# Patient Record
Sex: Female | Born: 1980 | Race: White | Hispanic: No | Smoking: Former smoker
Health system: Southern US, Community
[De-identification: ages and names within clinical notes are randomized; demographics above are authoritative.]

## PROBLEM LIST (undated history)

## (undated) DIAGNOSIS — I10 Essential (primary) hypertension: Secondary | ICD-10-CM

## (undated) DIAGNOSIS — M549 Dorsalgia, unspecified: Secondary | ICD-10-CM

## (undated) HISTORY — PX: KNEE SURGERY: SHX244

## (undated) HISTORY — PX: COSMETIC SURGERY: SHX468

## (undated) HISTORY — PX: BACK SURGERY: SHX140

---

## 2001-02-19 ENCOUNTER — Emergency Department (HOSPITAL_COMMUNITY): Admission: EM | Admit: 2001-02-19 | Discharge: 2001-02-19 | Payer: Self-pay | Admitting: Emergency Medicine

## 2001-03-03 ENCOUNTER — Ambulatory Visit (HOSPITAL_COMMUNITY)
Admission: RE | Admit: 2001-03-03 | Discharge: 2001-03-03 | Payer: Self-pay | Admitting: Physical Medicine and Rehabilitation

## 2001-03-03 ENCOUNTER — Encounter: Payer: Self-pay | Admitting: Physical Medicine and Rehabilitation

## 2001-03-21 ENCOUNTER — Encounter: Payer: Self-pay | Admitting: Physical Medicine and Rehabilitation

## 2001-03-21 ENCOUNTER — Encounter
Admission: RE | Admit: 2001-03-21 | Discharge: 2001-03-21 | Payer: Self-pay | Admitting: Physical Medicine and Rehabilitation

## 2001-06-03 ENCOUNTER — Encounter: Payer: Self-pay | Admitting: Specialist

## 2001-06-03 ENCOUNTER — Encounter (INDEPENDENT_AMBULATORY_CARE_PROVIDER_SITE_OTHER): Payer: Self-pay

## 2001-06-03 ENCOUNTER — Observation Stay (HOSPITAL_COMMUNITY): Admission: RE | Admit: 2001-06-03 | Discharge: 2001-06-05 | Payer: Self-pay | Admitting: Specialist

## 2002-01-03 ENCOUNTER — Encounter: Payer: Self-pay | Admitting: Emergency Medicine

## 2002-01-03 ENCOUNTER — Emergency Department (HOSPITAL_COMMUNITY): Admission: EM | Admit: 2002-01-03 | Discharge: 2002-01-03 | Payer: Self-pay | Admitting: Emergency Medicine

## 2002-12-12 ENCOUNTER — Emergency Department (HOSPITAL_COMMUNITY): Admission: EM | Admit: 2002-12-12 | Discharge: 2002-12-12 | Payer: Self-pay | Admitting: Emergency Medicine

## 2003-03-09 ENCOUNTER — Emergency Department (HOSPITAL_COMMUNITY): Admission: EM | Admit: 2003-03-09 | Discharge: 2003-03-09 | Payer: Self-pay | Admitting: Emergency Medicine

## 2003-05-20 ENCOUNTER — Ambulatory Visit (HOSPITAL_COMMUNITY): Admission: AD | Admit: 2003-05-20 | Discharge: 2003-05-20 | Payer: Self-pay | Admitting: Obstetrics and Gynecology

## 2003-10-14 ENCOUNTER — Ambulatory Visit (HOSPITAL_COMMUNITY): Admission: AD | Admit: 2003-10-14 | Discharge: 2003-10-14 | Payer: Self-pay | Admitting: Obstetrics and Gynecology

## 2003-10-19 ENCOUNTER — Inpatient Hospital Stay (HOSPITAL_COMMUNITY): Admission: RE | Admit: 2003-10-19 | Discharge: 2003-10-22 | Payer: Self-pay | Admitting: Obstetrics and Gynecology

## 2004-08-13 ENCOUNTER — Emergency Department (HOSPITAL_COMMUNITY): Admission: EM | Admit: 2004-08-13 | Discharge: 2004-08-13 | Payer: Self-pay | Admitting: *Deleted

## 2004-11-03 ENCOUNTER — Emergency Department (HOSPITAL_COMMUNITY): Admission: EM | Admit: 2004-11-03 | Discharge: 2004-11-04 | Payer: Self-pay | Admitting: *Deleted

## 2004-11-05 ENCOUNTER — Emergency Department (HOSPITAL_COMMUNITY): Admission: EM | Admit: 2004-11-05 | Discharge: 2004-11-05 | Payer: Self-pay | Admitting: *Deleted

## 2004-11-08 ENCOUNTER — Emergency Department (HOSPITAL_COMMUNITY): Admission: EM | Admit: 2004-11-08 | Discharge: 2004-11-08 | Payer: Self-pay | Admitting: Emergency Medicine

## 2004-12-11 ENCOUNTER — Emergency Department (HOSPITAL_COMMUNITY): Admission: EM | Admit: 2004-12-11 | Discharge: 2004-12-12 | Payer: Self-pay | Admitting: Emergency Medicine

## 2005-11-30 ENCOUNTER — Inpatient Hospital Stay (HOSPITAL_COMMUNITY): Admission: AD | Admit: 2005-11-30 | Discharge: 2005-12-02 | Payer: Self-pay | Admitting: Obstetrics and Gynecology

## 2006-02-11 ENCOUNTER — Emergency Department (HOSPITAL_COMMUNITY): Admission: EM | Admit: 2006-02-11 | Discharge: 2006-02-11 | Payer: Self-pay | Admitting: Emergency Medicine

## 2006-09-16 ENCOUNTER — Emergency Department (HOSPITAL_COMMUNITY): Admission: EM | Admit: 2006-09-16 | Discharge: 2006-09-16 | Payer: Self-pay | Admitting: Emergency Medicine

## 2007-02-05 ENCOUNTER — Other Ambulatory Visit: Admission: RE | Admit: 2007-02-05 | Discharge: 2007-02-05 | Payer: Self-pay | Admitting: Obstetrics and Gynecology

## 2007-03-19 ENCOUNTER — Emergency Department (HOSPITAL_COMMUNITY): Admission: EM | Admit: 2007-03-19 | Discharge: 2007-03-19 | Payer: Self-pay | Admitting: Emergency Medicine

## 2007-03-20 ENCOUNTER — Emergency Department (HOSPITAL_COMMUNITY): Admission: EM | Admit: 2007-03-20 | Discharge: 2007-03-20 | Payer: Self-pay | Admitting: Emergency Medicine

## 2007-05-03 ENCOUNTER — Emergency Department (HOSPITAL_COMMUNITY): Admission: EM | Admit: 2007-05-03 | Discharge: 2007-05-03 | Payer: Self-pay | Admitting: Emergency Medicine

## 2007-07-23 ENCOUNTER — Emergency Department (HOSPITAL_COMMUNITY): Admission: EM | Admit: 2007-07-23 | Discharge: 2007-07-23 | Payer: Self-pay | Admitting: Emergency Medicine

## 2008-09-13 ENCOUNTER — Emergency Department (HOSPITAL_COMMUNITY): Admission: EM | Admit: 2008-09-13 | Discharge: 2008-09-13 | Payer: Self-pay | Admitting: Emergency Medicine

## 2008-09-20 ENCOUNTER — Emergency Department (HOSPITAL_COMMUNITY): Admission: EM | Admit: 2008-09-20 | Discharge: 2008-09-20 | Payer: Self-pay | Admitting: Emergency Medicine

## 2008-12-01 ENCOUNTER — Emergency Department (HOSPITAL_COMMUNITY): Admission: EM | Admit: 2008-12-01 | Discharge: 2008-12-01 | Payer: Self-pay | Admitting: Emergency Medicine

## 2009-04-07 ENCOUNTER — Emergency Department (HOSPITAL_COMMUNITY): Admission: EM | Admit: 2009-04-07 | Discharge: 2009-04-07 | Payer: Self-pay | Admitting: Emergency Medicine

## 2009-05-24 ENCOUNTER — Emergency Department (HOSPITAL_COMMUNITY): Admission: EM | Admit: 2009-05-24 | Discharge: 2009-05-24 | Payer: Self-pay | Admitting: Emergency Medicine

## 2009-06-09 ENCOUNTER — Emergency Department (HOSPITAL_COMMUNITY): Admission: EM | Admit: 2009-06-09 | Discharge: 2009-06-10 | Payer: Self-pay | Admitting: Emergency Medicine

## 2010-01-10 ENCOUNTER — Emergency Department (HOSPITAL_COMMUNITY): Admission: EM | Admit: 2010-01-10 | Discharge: 2010-01-11 | Payer: Self-pay | Admitting: Emergency Medicine

## 2010-02-07 ENCOUNTER — Emergency Department (HOSPITAL_COMMUNITY): Admission: EM | Admit: 2010-02-07 | Discharge: 2010-02-07 | Payer: Self-pay | Admitting: Emergency Medicine

## 2010-02-16 ENCOUNTER — Inpatient Hospital Stay (HOSPITAL_COMMUNITY): Admission: AD | Admit: 2010-02-16 | Discharge: 2010-02-16 | Payer: Self-pay | Admitting: Obstetrics & Gynecology

## 2010-05-05 ENCOUNTER — Inpatient Hospital Stay (HOSPITAL_COMMUNITY)
Admission: AD | Admit: 2010-05-05 | Discharge: 2010-05-05 | Payer: Self-pay | Source: Home / Self Care | Attending: Obstetrics & Gynecology | Admitting: Obstetrics & Gynecology

## 2010-05-05 LAB — HCG, SERUM, QUALITATIVE: Preg, Serum: NEGATIVE

## 2010-05-05 LAB — CBC
Hemoglobin: 12.2 g/dL (ref 12.0–15.0)
MCH: 29.2 pg (ref 26.0–34.0)
Platelets: 276 10*3/uL (ref 150–400)
RBC: 4.18 MIL/uL (ref 3.87–5.11)
WBC: 7.3 10*3/uL (ref 4.0–10.5)

## 2010-05-05 LAB — ABO/RH: ABO/RH(D): A POS

## 2010-05-05 LAB — HCG, QUANTITATIVE, PREGNANCY: hCG, Beta Chain, Quant, S: <2 m[IU]/mL (ref <5)

## 2010-05-28 ENCOUNTER — Emergency Department (HOSPITAL_COMMUNITY)
Admission: EM | Admit: 2010-05-28 | Discharge: 2010-05-28 | Disposition: A | Discharge status: Home / Self Care | Payer: Self-pay | Attending: Emergency Medicine | Admitting: Emergency Medicine

## 2010-05-28 DIAGNOSIS — G43909 Migraine, unspecified, not intractable, without status migrainosus: Secondary | ICD-10-CM | POA: Insufficient documentation

## 2010-05-28 DIAGNOSIS — R112 Nausea with vomiting, unspecified: Secondary | ICD-10-CM | POA: Insufficient documentation

## 2010-06-21 LAB — CBC
HCT: 37.4 % (ref 36.0–46.0)
MCHC: 33.6 g/dL (ref 30.0–36.0)
Platelets: 339 10*3/uL (ref 150–400)
RDW: 12.5 % (ref 11.5–15.5)
WBC: 8.1 10*3/uL (ref 4.0–10.5)

## 2010-06-21 LAB — WET PREP, GENITAL
Trich, Wet Prep: NONE SEEN
Yeast Wet Prep HPF POC: NONE SEEN

## 2010-06-21 LAB — URINE MICROSCOPIC-ADD ON

## 2010-06-21 LAB — URINALYSIS, ROUTINE W REFLEX MICROSCOPIC
Glucose, UA: NEGATIVE mg/dL
Ketones, ur: NEGATIVE mg/dL
Nitrite: NEGATIVE
Protein, ur: NEGATIVE mg/dL
Urobilinogen, UA: 1 mg/dL (ref 0.0–1.0)

## 2010-06-21 LAB — GC/CHLAMYDIA PROBE AMP, GENITAL: Chlamydia, DNA Probe: POSITIVE — AB

## 2010-06-21 LAB — ABO/RH: ABO/RH(D): A POS

## 2010-06-21 LAB — URINE CULTURE: Colony Count: 9000

## 2010-06-22 LAB — URINE MICROSCOPIC-ADD ON

## 2010-06-22 LAB — URINALYSIS, ROUTINE W REFLEX MICROSCOPIC
Ketones, ur: NEGATIVE mg/dL
Nitrite: NEGATIVE
Protein, ur: NEGATIVE mg/dL
pH: 6.5 (ref 5.0–8.0)

## 2010-06-22 LAB — PREGNANCY, URINE: Preg Test, Ur: NEGATIVE

## 2010-07-12 ENCOUNTER — Emergency Department (HOSPITAL_COMMUNITY)
Admission: EM | Admit: 2010-07-12 | Discharge: 2010-07-13 | Disposition: A | Discharge status: Home / Self Care | Payer: Self-pay | Attending: Emergency Medicine | Admitting: Emergency Medicine

## 2010-07-12 DIAGNOSIS — R110 Nausea: Secondary | ICD-10-CM | POA: Insufficient documentation

## 2010-07-12 DIAGNOSIS — E669 Obesity, unspecified: Secondary | ICD-10-CM | POA: Insufficient documentation

## 2010-07-12 DIAGNOSIS — Z87442 Personal history of urinary calculi: Secondary | ICD-10-CM | POA: Insufficient documentation

## 2010-07-12 DIAGNOSIS — G43909 Migraine, unspecified, not intractable, without status migrainosus: Secondary | ICD-10-CM | POA: Insufficient documentation

## 2010-07-18 LAB — WET PREP, GENITAL
Trich, Wet Prep: NONE SEEN
Yeast Wet Prep HPF POC: NONE SEEN

## 2010-07-18 LAB — CBC
HCT: 33.9 % — ABNORMAL LOW (ref 36.0–46.0)
HCT: 35.8 % — ABNORMAL LOW (ref 36.0–46.0)
MCHC: 35.5 g/dL (ref 30.0–36.0)
MCV: 84.7 fL (ref 78.0–100.0)
MCV: 85.6 fL (ref 78.0–100.0)
Platelets: 290 10*3/uL (ref 150–400)
Platelets: 316 10*3/uL (ref 150–400)
RDW: 12.7 % (ref 11.5–15.5)
RDW: 13.2 % (ref 11.5–15.5)
WBC: 4.7 10*3/uL (ref 4.0–10.5)

## 2010-07-18 LAB — URINALYSIS, ROUTINE W REFLEX MICROSCOPIC
Ketones, ur: NEGATIVE mg/dL
Leukocytes, UA: NEGATIVE
Leukocytes, UA: NEGATIVE
Protein, ur: NEGATIVE mg/dL
Protein, ur: NEGATIVE mg/dL
Urobilinogen, UA: 0.2 mg/dL (ref 0.0–1.0)
Urobilinogen, UA: 0.2 mg/dL (ref 0.0–1.0)

## 2010-07-18 LAB — DIFFERENTIAL
Basophils Absolute: 0 10*3/uL (ref 0.0–0.1)
Basophils Relative: 0 % (ref 0–1)
Eosinophils Absolute: 0.1 10*3/uL (ref 0.0–0.7)
Eosinophils Relative: 1 % (ref 0–5)
Lymphocytes Relative: 29 % (ref 12–46)
Monocytes Absolute: 0.3 10*3/uL (ref 0.1–1.0)
Neutro Abs: 2.9 10*3/uL (ref 1.7–7.7)
Neutrophils Relative %: 62 % (ref 43–77)
Neutrophils Relative %: 68 % (ref 43–77)

## 2010-07-18 LAB — BASIC METABOLIC PANEL
BUN: 11 mg/dL (ref 6–23)
CO2: 29 mEq/L (ref 19–32)
Chloride: 105 mEq/L (ref 96–112)
Creatinine, Ser: 0.96 mg/dL (ref 0.4–1.2)
Glucose, Bld: 101 mg/dL — ABNORMAL HIGH (ref 70–99)

## 2010-07-18 LAB — URINE CULTURE

## 2010-07-18 LAB — COMPREHENSIVE METABOLIC PANEL
Albumin: 3.6 g/dL (ref 3.5–5.2)
BUN: 9 mg/dL (ref 6–23)
Chloride: 106 mEq/L (ref 96–112)
Creatinine, Ser: 0.84 mg/dL (ref 0.4–1.2)
Total Bilirubin: 0.5 mg/dL (ref 0.3–1.2)

## 2010-07-18 LAB — LIPASE, BLOOD: Lipase: 19 U/L (ref 11–59)

## 2010-07-18 LAB — GC/CHLAMYDIA PROBE AMP, GENITAL: Chlamydia, DNA Probe: NEGATIVE

## 2010-07-18 LAB — URINE MICROSCOPIC-ADD ON

## 2010-08-26 NOTE — Op Note (Signed)
[facility]  Patient:    Michelle Mcdowell, Michelle Mcdowell Visit Number: [id number] MRN: [id number]          Service Type: SUR Location: 4W [id number] [id number] Attending Physician:  Pierce Crane Dictated by:   Javier Docker, M.D. Proc. Date: 06/03/01 Admit Date:  06/03/2001                             Operative Report  PREOPERATIVE DIAGNOSIS:  Herniated nucleus pulposus L5-S1, left.  POSTOPERATIVE DIAGNOSIS:  Herniated nucleus pulposus L5-S1, left.  PROCEDURE:  Microdiskectomy L5-S1, left.  ANESTHESIA:  General.  SURGEON:  Javier Docker, M.D.  ASSISTANT:  Dorie Rank, P.A.-C.  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  Despite extensive conservative treatment and epidural steroid injections, the patient persistently presented to the clinic with severe left lower extremity radicular pain.  Following the last clinical meeting, we discussed her options, and she and mother decided to proceed with a microdiskectomy, discussing the risks and benefits including bleeding, infection, damage to vascular structures, CSF leakage, epidural fibrosis, need for fusion in the future, etc.  Preoperatively, the patient demonstrated positive neural tension signs, ______ plantar flexion, ulnar sensation of the S1 dermatome.  The patient denied any other medical problems, denied the possibility of pregnancy.  TECHNIQUE:  Patient in supine position.  After an adequate level of general anesthesia, 1 g Kefzol, she was placed prone on the Cleary frame.  All bony prominences well-padded.  The lumbar region was prepped and draped in the usual sterile fashion.  Two 18 gauge spinal needles were utilized to localize the L5-S1 interspace, confirmed with x-ray.  Incision was made from the spinous process of L5-S1.  Subcutaneous tissue was dissected.  Electrocautery was utilized to achieve  hemostasis.  Dorsolumbar fascia identified and divided in line with the skin incision.  Paraspinous muscles elevated from the lamina of L5 and S1.  McCullough retractor was placed.  Confirmatory radiograph obtained.  Penfield 4 into the interlaminar space.  Radiographs were suboptimal due to the patients size.  Ligamentum flavum was detached from the cephalad edge of the S1 and the caudad edge of L5 and removed from the interspace.  There was significant tension on the S1 nerve root noted. Remnants of corticosteroid injection was noted in this interlaminar space. The nerve root was gently mobilized medially at large focal HNP that was consistent with that seen on the MRI was noted.  With neural elements well-protected, annulotomy was performed, and copious portion of disk material was removed from this sub-PLL region.  Multiple fragments removed from the disk space as well.  This greatly freed up the nerve root, so there was normal excursion.  It was erythematous and edematous, however.  Hockey-stick probe placed in the foramen of L5-S1 found to be widely patent.   I checked beneath the axilla of the root, beneath the thecal sac, and cephalad without evidence of residual disk material.  A thorough diskectomy had been performed.  There was no evidence of active bleeding or CSF leakage.  No residual disk herniation material noted.  This was sent for a specimen.  The disk space was copiously irrigated with antibiotic irrigation.  There was no active bleeding noted.  McCullough retractor was removed and paraspinous muscles inspected with no evidence of active bleeding.  There was good epidural fat that was draped  over the operative site.  Dorsolumbar fascia reapproximated with #1 Vicryl interrupted figure-of-eight sutures with a watertight closure. Subcutaneous tissue reapproximated with 2-0 Vicryl simple sutures.  Skin was reapproximated with 4-0 subcuticular PDS and reinforced with  Steri-Strips. Sterile dressing was applied.  The patient was then placed supine on the hospital bed, extubated without difficulty, and transported to the recovery room in satisfactory condition.  The patient tolerated the procedure well with no known complications. Dictated by:   Javier Docker, M.D. Attending Physician:  Pierce Crane DD:  06/03/01 TD:  06/03/01 Job: [id number]

## 2010-08-26 NOTE — Group Therapy Note (Signed)
NAME:  JOHN, VASCONCELOS               ACCOUNT NO.:  [id number]   MEDICAL RECORD NO.:  [id number]          PATIENT TYPE:  INP   LOCATION:  LDR1                          FACILITY:  [facility]   PHYSICIAN:  Tilda Burrow, M.D. DATE OF BIRTH:  1980/12/01   DATE OF PROCEDURE:  DATE OF DISCHARGE:                                   PROGRESS NOTE   Vernice progressed steadily through labor.  After she received her epidural at  approximately 0730 she was comfortable with the epidural.  She did have some  variable decelerations and some early decelerations for an hour or two.  However, she always remained good variability with some accelerations in-  between and the decelerations were not with every contraction.  She was  noted to be fully dilated at 1123.  She was complaining of a lot of pressure  at this time.  However, the baby was at 0 to +1 station and she had a good  pushing effort so we decided just to go ahead and push.  After a brief  second stage she had a spontaneous vaginal delivery of a viable female infant  at 31.  The mouth and nose were suctioned on the perineum.  She had two  nuchal cords which were reduced before the delivery of the body, and the  body delivered without any difficulty.  Apgars are 8 and 9.  Weight is 7  pounds 7 ounces.  Pitocin 20 units diluted in 1000 mL of lactated Ringer's  was infused rapidly IV.  The placenta separated spontaneously and delivered  via controlled cord traction and maternal pushing effort at 1152.  It was  inspected and appears to be intact with a three-vessel cord.  Estimated  blood loss was 200 mL.  The vagina was inspected and no lacerations were  found.  The epidural catheter was removed at approximately 1200 with the  blue tip visualized as being intact.      Jacklyn Shell, C.N.M.      Tilda Burrow, M.D.  Electronically Signed    FC/MEDQ  D:  12/01/2005  T:  12/01/2005  Job:  [id number]   cc:   Dr. Gerda Diss

## 2010-08-26 NOTE — Op Note (Signed)
[name], [name]               ACCOUNT NO.:  [id number]   MEDICAL RECORD NO.:  [id number]          PATIENT TYPE:  INP   LOCATION:  LDR1                          FACILITY:  [facility]   PHYSICIAN:  Lazaro Arms, M.D.   DATE OF BIRTH:  1980-09-02   DATE OF PROCEDURE:  12/01/2005  DATE OF DISCHARGE:                                 OPERATIVE REPORT   PROCEDURE:  Epidural.   Michelle Mcdowell is a 30 year old gravida 3, para 1, at 39-1/2 weeks' gestation, who  is in active phase of labor and requesting epidural to be placed.  She is 6  cm dilated.  The patient was placed in sitting position.  Betadine prep was  used.  Lidocaine 1% was injected in the L3-4 interspace.  The area was field-  draped.  A 17-gauge Tuohy needle is used and the loss of resistance  technique employed and the epidural space was found with one pass without  difficulty.  Bupivacaine plain 1.5% 10 mL was given as a test dose without  ill effects.  The epidural catheter was intubated into the epidural space  and taped down 5 cm into the epidural space.  Ten milliliters of 1.5%  bupivacaine is given to dose up the epidural.  The patient is getting good  relief.  Fetal heart rate tracing stable, blood pressure stable.  The  continuous infusion is begun at 12 mL/hr. of 0.125% bupivacaine with 2  mcg/mL of fentanyl.  We anticipate normal spontaneous vaginal delivery.      Lazaro Arms, M.D.  Electronically Signed     LHE/MEDQ  D:  12/01/2005  T:  12/01/2005  Job:  [id number]

## 2010-08-26 NOTE — Op Note (Signed)
NAME:  Michelle Mcdowell, Michelle Mcdowell                         ACCOUNT NO.:  [id number]   MEDICAL RECORD NO.:  [id number]                   PATIENT TYPE:  INP   LOCATION:  LDR1                                 FACILITY:  [facility]   PHYSICIAN:  Tilda Burrow, M.D.              DATE OF BIRTH:  12-16-1980   DATE OF PROCEDURE:  DATE OF DISCHARGE:                                 OPERATIVE REPORT   DELIVERY NOTE:  [name] remained quite comfortable with her epidural and  progressed steadily through labor.  She was noted to have an anterior lip  approximately 12 o'clock; however, since she did not have an urge to push,  we allowed her to await.  At approximately quarter to 1, she did develop  some pressure, and the baby was noted to be at +3 station, so we started  pushing.  After a very brief second stage, she delivered a viable female  infant at 39.  The mouth and nose were suctioned on the perineum with a  DeLee suction, and then the cord was doubly clamped and cut and handed to  Dr. Milinda Cave for endotracheal suctioning.  However, she did have spontaneous  respirations and a lusty cry.  You can see the nursery note for Dr.  Samul Dada efforts.  Twenty units of Pitocin diluted in 1000 mL of lactated  Ringer's was infused rapidly IV.  The placenta separated spontaneously and  was delivered via controlled cord traction at 1317.  It was inspected and  appeared to be intact with a three-vessel cord; however, there does appear  to be a 5 cm __________.  It is being sent to pathology for evaluation.  Very little blood loss was noted.  The vagina was inspected, and no  lacerations were found.  Estimated blood loss 250 mL.     ________________________________________  ___________________________________________  Jacklyn Shell, C.N.M.           Tilda Burrow, M.D.   FC/MEDQ  D:  10/20/2003  T:  10/20/2003  Job:  [id number]

## 2010-08-26 NOTE — Op Note (Signed)
NAME:  Michelle Mcdowell, YARBERRY                         ACCOUNT NO.:  [id number]   MEDICAL RECORD NO.:  [id number]                   PATIENT TYPE:  INP   LOCATION:  A426                                 FACILITY:  [facility]   PHYSICIAN:  Lazaro Arms, M.D.                DATE OF BIRTH:  [birth year]   DATE OF PROCEDURE:  10/20/2003  DATE OF DISCHARGE:  10/22/2003                                  PROCEDURE NOTE   [name] is a 30 year old white female, gravida 2, para 0, abortus 1, in active  phase of labor who is requesting an epidural be placed. She does have a  ruptured disk at L5-S1. She is status post surgery in 2003. The patient  understands that an epidural should not in any way complicate this issue.  She continues to have back pain despite her surgery during the pregnancy and  even before the pregnancy, and she has been informed that epidural studies  show do not cause ongoing back problems. With this in mind, however, she is  informed that anything is possible, and we can place the epidural, but we  cannot say for sure it may not cause some difficulty. She did want to have  it placed.   As a result, she is placed in seated position. Betadine prepped. Lidocaine  1% is injected in the L2-L3 interspace and field draped. A 17-gauge Tuohy  needle is used. Loss-of-resistance technique employed. With one pass, the  epidural space is found without difficulty. Then, 10 cc of 0.125%  bupivacaine plain is given as a test dose with no ill effects. An additional  0.125% bupivacaine is given to dose up the epidural through the epidural  catheter without ill effects. Catheter is tapped down 5 cm in the epidural  space. The patient tolerated the procedure well. She is getting good relief,  has a stable blood pressure, and the fetal heart rate tracing is stable.      ___________________________________________                                            Lazaro Arms, M.D.   LHE/MEDQ  D:  12/03/2003  T:   12/03/2003  Job:  [id number]

## 2010-08-26 NOTE — H&P (Signed)
NAME:  Michelle Mcdowell, Michelle Mcdowell               ACCOUNT NO.:  [id number]   MEDICAL RECORD NO.:  [id number]          PATIENT TYPE:  INP   LOCATION:  LDR1                          FACILITY:  [facility]   PHYSICIAN:  Tilda Burrow, M.D. DATE OF BIRTH:  06-07-1980   DATE OF ADMISSION:  DATE OF DISCHARGE:  LH                                HISTORY & PHYSICAL   Pregnancy at [redacted] weeks gestation, elective induction of labor for social  reasons.   HISTORY OF PRESENT ILLNESS:  This is a 30 year old female gravida 3, para 1,  AB 1; LMP March 02, 2006; placing menstrual [facility] December 07, 2005.  She has  a 14-week ultrasound which suggests August 23 and a 20-week ultrasound  suggesting August 27.  She is 39 weeks or more by all these criteria.  She  is admitted November 30, 2005, for Foley bulb cervical ripening.  This is an  elective induction at patient request due to her fiance's job obligations  beginning August 28 which take him away out of town for 3 consecutive weeks.  The patient wishes to be induced and we will honor her request.  The cervix  is multiparous and likely therefore inducible.  Cervix remains long and  closed, posterior, but soft at last office visit November 28, 2005.  The  patient is aware that induced labors have an increased risk of prolonged  labor and slightly increased risk of C-section delivery even in multiparas.   PAST MEDICAL HISTORY:  Benign.   SURGICAL HISTORY:  Back surgery, knee surgery.   ALLERGIES:  None.   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   EXAMINATION:  GENERAL:  Shows a large-framed Caucasian female.  VITAL SIGNS:  Weight 269, which is a 35-pound weight gain, blood pressure  138/87, fundal height 38 cm, estimated fetal weight 7 pounds 12 ounces.  PELVIC:  Cervix long, closed, posterior, soft.   Blood type A positive.  Urine drug screen negative.  Hemoglobin 11,  hematocrit 36.  Hepatitis, HIV, RPR, GC, and chlamydia all  negative.  HSV  type 2 negative.  Group B strep negative.  MSAFP normal.  Glucose tolerance  test 95  mg%.  She plans to breast feed her baby, plans to use Implanon or Mirena.  She is still considering permanent sterilization.   PLAN:  Foley bulb August 23, Pitocin August 24, possible epidural candidate.      Tilda Burrow, M.D.  Electronically Signed     JVF/MEDQ  D:  11/28/2005  T:  11/28/2005  Job:  [id number]

## 2010-08-26 NOTE — H&P (Signed)
NAME:  Michelle Mcdowell, Michelle Mcdowell                         ACCOUNT NO.:  [id number]   MEDICAL RECORD NO.:  [id number]                   PATIENT TYPE:  INP   LOCATION:  LDR1                                 FACILITY:  [facility]   PHYSICIAN:  Tilda Burrow, M.D.              DATE OF BIRTH:  [birth year]   DATE OF ADMISSION:  10/19/2003  DATE OF DISCHARGE:                                HISTORY & PHYSICAL   ADMITTING DIAGNOSIS:  Pregnancy, 40 weeks 3 days; cervical favorability;  medical induction of labor, elective.   HISTORY OF PRESENT ILLNESS:  This is a 30 year old female, gravida 2, para  0, Ab1, LMP January 09, 2003, placing menstrual [facility] October 16, 2003, with 2  second trimester ultrasounds corresponding with menstrual EDC, based on  ultrasounds at 15 and 19 weeks.  She is admitted after reaching 40 weeks 3  days with cervical dilation to only 1 cm with cervix soft but remaining  somewhat posterior, vertex presentation confirmed at a -2 station.  Options  of continued observation have been discussed with the patient; she favors  induction at this time.   The patient is fully aware that all the usual risks of labor management  including need for emergency intervention by cesarean section or vaginal  means may occur with induced labor such as can occur with spontaneous labor.  Additionally, the patient requests consideration of epidural.  Significant  history is that she has had an L5-S1 microdiskectomy in the past, and the  record has been reviewed.  She would appear to be a candidate for an L2-3  effort at epidural.   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   SURGICAL HISTORY:  1. Ruptured disk, L5-S1, with surgery in 2003.  2. Knee surgery, date unknown.   ALLERGIES:  None known.   HABITS:  Cigarettes, alcohol and recreational drugs denied.   SOCIAL HISTORY:   Employed at [employer], lives with grandmother,  supportive father of the baby, Beckey Rutter, age 85, who is having his first child  at this time.  The patient did not attend classes unfortunately; she has  extensive support from family members but no one took her to classes.   PHYSICAL EXAM:  VITAL SIGNS:  Height 5 foot 4 inches, weight 245, which is a  43-pound weight gain.  Blood pressure 118/70 at last office visit.  ABDOMEN:  Fundal height has leveled off at 35 cm.  Estimated fetal weight is  7 pounds.  PELVIC:  Cervix 1 cm, soft, long, posterior, -2 station, vertex  presentation.   PRENATAL LABORATORY DATA:  Blood type A-positive.  Urine drug screen  negative.  Rubella immunity present.  Hemoglobin 11, hematocrit 34.  Hepatitis, HIV, GC and Chlamydia all negative.  HPV:  High-risk subtypes  identified on Pap, which  showed low-grade cervical abnormalities.  MSAFP was  normal at 1 in 1700.  Group B strep negative.  Glucose tolerance test 127  mg%.   PLAN:  Foley bulb inserted at 7 p.m. on October 19, 2003 with cervical ripening  overnight and Pitocin to be induced beginning in a.m.     ___________________________________________                                         Tilda Burrow, M.D.   JVF/MEDQ  D:  10/19/2003  T:  10/20/2003  Job:  [id number]   cc:   Francoise Schaumann. Halm, D.O.  [address], [address]  [place]  [place] [address]  Fax: [phone]

## 2010-10-04 ENCOUNTER — Emergency Department (HOSPITAL_COMMUNITY)
Admission: EM | Admit: 2010-10-04 | Discharge: 2010-10-04 | Disposition: A | Discharge status: Home / Self Care | Payer: Medicaid Other | Attending: Emergency Medicine | Admitting: Emergency Medicine

## 2010-10-04 DIAGNOSIS — O99891 Other specified diseases and conditions complicating pregnancy: Secondary | ICD-10-CM | POA: Insufficient documentation

## 2010-10-04 DIAGNOSIS — R51 Headache: Secondary | ICD-10-CM | POA: Insufficient documentation

## 2010-10-15 DIAGNOSIS — O99891 Other specified diseases and conditions complicating pregnancy: Secondary | ICD-10-CM | POA: Insufficient documentation

## 2010-10-15 DIAGNOSIS — R109 Unspecified abdominal pain: Secondary | ICD-10-CM | POA: Insufficient documentation

## 2010-10-16 ENCOUNTER — Encounter: Payer: Self-pay | Admitting: *Deleted

## 2010-10-16 ENCOUNTER — Emergency Department (HOSPITAL_COMMUNITY)
Admission: EM | Admit: 2010-10-16 | Discharge: 2010-10-16 | Discharge status: Against Medical Advice | Payer: Medicaid Other | Attending: Emergency Medicine | Admitting: Emergency Medicine

## 2010-10-16 NOTE — ED Notes (Signed)
Pt called x 2 with no answer  

## 2010-10-16 NOTE — ED Notes (Signed)
Pt is [redacted] wks pregnant. 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  Pt woke up this morning with right side abdominal cramping/burnin/pulling sensation. Pt c/o odor to urine and white discharge.

## 2010-10-16 NOTE — ED Notes (Signed)
1st attempt made by Lawson Fiscal, RN 2nd attempt at calling pt back made by Inetta Fermo, RN 3rd attempt at calling pt back made by Lawson Fiscal, RN Person in waiting room stated pt had left. Registration did not see.

## 2010-10-16 NOTE — ED Notes (Signed)
Pt called x 3 with no answer

## 2010-12-28 ENCOUNTER — Other Ambulatory Visit: Payer: Self-pay | Admitting: Obstetrics and Gynecology

## 2010-12-28 ENCOUNTER — Other Ambulatory Visit (HOSPITAL_COMMUNITY)
Admission: RE | Admit: 2010-12-28 | Discharge: 2010-12-28 | Disposition: A | Discharge status: Home / Self Care | Payer: Medicaid Other | Source: Ambulatory Visit | Attending: Obstetrics and Gynecology | Admitting: Obstetrics and Gynecology

## 2010-12-28 DIAGNOSIS — Z01419 Encounter for gynecological examination (general) (routine) without abnormal findings: Secondary | ICD-10-CM | POA: Insufficient documentation

## 2010-12-28 DIAGNOSIS — Z113 Encounter for screening for infections with a predominantly sexual mode of transmission: Secondary | ICD-10-CM | POA: Insufficient documentation

## 2010-12-30 ENCOUNTER — Encounter (HOSPITAL_COMMUNITY): Payer: Self-pay | Admitting: *Deleted

## 2010-12-30 ENCOUNTER — Emergency Department (HOSPITAL_COMMUNITY)
Admission: EM | Admit: 2010-12-30 | Discharge: 2010-12-30 | Disposition: A | Discharge status: Home / Self Care | Payer: Medicaid Other | Attending: Emergency Medicine | Admitting: Emergency Medicine

## 2010-12-30 DIAGNOSIS — G43909 Migraine, unspecified, not intractable, without status migrainosus: Secondary | ICD-10-CM | POA: Insufficient documentation

## 2010-12-30 MED ORDER — MORPHINE SULFATE 10 MG/ML IJ SOLN
10.0000 mg | Freq: Once | INTRAMUSCULAR | Status: AC
  Administered 2010-12-30: 10 mg via INTRAMUSCULAR
  Filled 2010-12-30: qty 1

## 2010-12-30 MED ORDER — HYDROCODONE-ACETAMINOPHEN 7.5-325 MG PO TABS: 1.0000 | ORAL_TABLET | ORAL | Status: AC | PRN

## 2010-12-30 MED ORDER — DIPHENHYDRAMINE HCL 25 MG PO CAPS
25.0000 mg | ORAL_CAPSULE | Freq: Once | ORAL | Status: AC
  Administered 2010-12-30: 25 mg via ORAL

## 2010-12-30 MED ORDER — DIPHENHYDRAMINE HCL 12.5 MG/5ML PO ELIX
12.5000 mg | ORAL_SOLUTION | Freq: Once | ORAL | Status: DC
  Filled 2010-12-30: qty 5

## 2010-12-30 MED ORDER — DIPHENHYDRAMINE HCL 25 MG PO CAPS
ORAL_CAPSULE | ORAL | Status: AC
  Administered 2010-12-30: 25 mg via ORAL
  Filled 2010-12-30: qty 1

## 2010-12-30 MED ORDER — PROMETHAZINE HCL 12.5 MG PO TABS
25.0000 mg | ORAL_TABLET | Freq: Once | ORAL | Status: AC
  Administered 2010-12-30: 25 mg via ORAL
  Filled 2010-12-30 (×2): qty 1

## 2010-12-30 MED ORDER — PROMETHAZINE HCL 25 MG PO TABS: 25.0000 mg | ORAL_TABLET | Freq: Four times a day (QID) | ORAL | Status: AC | PRN

## 2010-12-30 NOTE — ED Notes (Signed)
Pt c/o migraine headache, photophobia and nausea x 2 days. Pt has history of migraines.

## 2010-12-30 NOTE — ED Provider Notes (Signed)
History     CSN: [id number] Arrival date & time: 12/30/2010  5:50 PM  Chief Complaint  Patient presents with  . Migraine    HPI  (Consider location/radiation/quality/duration/timing/severity/associated sxs/prior treatment)  Patient is a 30 y.o. female presenting with migraine. The history is provided by the patient.  Migraine This is a recurrent problem. The current episode started yesterday. The problem occurs constantly. The problem has been gradually worsening. Associated symptoms include headaches. Pertinent negatives include no abdominal pain, arthralgias, chest pain, chills, coughing, nausea, neck pain or vomiting. The symptoms are aggravated by nothing. She has tried acetaminophen for the symptoms. The treatment provided no relief.    History reviewed. No pertinent past medical history.  Past Surgical History  Procedure Date  . Back surgery   . Knee surgery     History reviewed. No pertinent family history.  History  Substance Use Topics  . Smoking status: Never Smoker   . Smokeless tobacco: Not on file  . Alcohol Use: No    OB History    Grav Para Term Preterm Abortions TAB SAB Ect Mult Living   8 2   5     2       Review of Systems  Review of Systems  Constitutional: Negative for chills and activity change.       All ROS Neg except as noted in HPI  HENT: Negative for nosebleeds and neck pain.   Eyes: Negative for photophobia and discharge.  Respiratory: Negative for cough, shortness of breath and wheezing.   Cardiovascular: Negative for chest pain and palpitations.  Gastrointestinal: Negative for nausea, vomiting, abdominal pain and blood in stool.  Genitourinary: Negative for dysuria, frequency and hematuria.  Musculoskeletal: Negative for back pain and arthralgias.  Skin: Negative.   Neurological: Positive for headaches. Negative for dizziness, seizures and speech difficulty.  Psychiatric/Behavioral: Negative for hallucinations and confusion.     Allergies  Review of patient's allergies indicates no known allergies.  Home Medications   Current Outpatient Rx  Name Route Sig Dispense Refill  . ACETAMINOPHEN 500 MG PO TABS Oral Take 1,000 mg by mouth 2 (two) times daily as needed. For pain       Physical Exam    BP 116/75  Pulse 97  Temp(Src) 98.6 F (37 C) (Oral)  Resp 20  Ht 5\' 2"  (1.575 m)  Wt 232 lb (105.235 kg)  BMI 42.43 kg/m2  SpO2 100%  LMP 10/29/2010  Breastfeeding? Unknown  Physical Exam  Nursing note and vitals reviewed. Constitutional: She is oriented to person, place, and time. She appears well-developed and well-nourished.  Non-toxic appearance.  HENT:  Head: Normocephalic.  Right Ear: Tympanic membrane and external ear normal.  Left Ear: Tympanic membrane and external ear normal.  Eyes: EOM and lids are normal. Pupils are equal, round, and reactive to light.  Neck: Normal range of motion. Neck supple. Carotid bruit is not present.  Cardiovascular: Normal rate, regular rhythm, normal heart sounds, intact distal pulses and normal pulses.   Pulmonary/Chest: Breath sounds normal. No respiratory distress.  Abdominal: Soft. Bowel sounds are normal. There is no tenderness. There is no guarding.  Musculoskeletal: Normal range of motion.  Lymphadenopathy:       Head (right side): No submandibular adenopathy present.       Head (left side): No submandibular adenopathy present.    She has no cervical adenopathy.  Neurological: She is alert and oriented to person, place, and time. She has normal strength. No cranial nerve deficit  or sensory deficit. Coordination normal.  Skin: Skin is warm and dry.  Psychiatric: She has a normal mood and affect. Her speech is normal.    ED Course: Pt denies any injury or fever. This headache is similar to previous migraines.  Procedures (including critical care time)  Labs Reviewed - No data to display No results found.   Dx: Migraine Headache  MDM I have reviewed  nursing notes, vital signs, and all appropriate lab and imaging results for this patient.        Kathie Dike, [place] 12/30/10 [phone]

## 2011-01-27 NOTE — ED Provider Notes (Signed)
Evaluation and management procedures were performed by the PA/NP under my supervision/collaboration.    Disha Cottam D Phenix Grein, MD 01/27/11 1920 

## 2011-02-14 ENCOUNTER — Emergency Department (HOSPITAL_COMMUNITY)
Admission: EM | Admit: 2011-02-14 | Discharge: 2011-02-14 | Disposition: A | Discharge status: Home / Self Care | Payer: Medicaid Other | Attending: Emergency Medicine | Admitting: Emergency Medicine

## 2011-02-14 ENCOUNTER — Encounter (HOSPITAL_COMMUNITY): Payer: Self-pay | Admitting: *Deleted

## 2011-02-14 DIAGNOSIS — R51 Headache: Secondary | ICD-10-CM

## 2011-02-14 DIAGNOSIS — O99891 Other specified diseases and conditions complicating pregnancy: Secondary | ICD-10-CM | POA: Insufficient documentation

## 2011-02-14 DIAGNOSIS — G43909 Migraine, unspecified, not intractable, without status migrainosus: Secondary | ICD-10-CM | POA: Insufficient documentation

## 2011-02-14 MED ORDER — DIPHENHYDRAMINE HCL 25 MG PO CAPS
25.0000 mg | ORAL_CAPSULE | Freq: Once | ORAL | Status: AC
  Administered 2011-02-14: 25 mg via ORAL
  Filled 2011-02-14: qty 1

## 2011-02-14 MED ORDER — MORPHINE SULFATE 10 MG/ML IJ SOLN
8.0000 mg | Freq: Once | INTRAMUSCULAR | Status: AC
  Administered 2011-02-14: 8 mg via INTRAMUSCULAR
  Filled 2011-02-14: qty 1

## 2011-02-14 MED ORDER — PROMETHAZINE HCL 12.5 MG PO TABS
25.0000 mg | ORAL_TABLET | Freq: Once | ORAL | Status: AC
  Administered 2011-02-14: 25 mg via ORAL
  Filled 2011-02-14: qty 2

## 2011-02-14 NOTE — ED Notes (Signed)
Headache, nausea, no vomiting.  No  Head injury.  Feel like "migraine"

## 2011-02-14 NOTE — ED Provider Notes (Signed)
Pt seen with PA Pt with HA, she reports typical migraine, reports long h/o HA in the past She is pregnant, though not hypertensive, no new LE edema No clonus noted on exam.  No significant edema is noted in her LE   Joya Gaskins, MD 02/14/11 2211

## 2011-02-14 NOTE — ED Provider Notes (Signed)
Medical screening examination/treatment/procedure(s) were conducted as a shared visit with non-physician practitioner(s) and myself.  I personally evaluated the patient during the encounter   BP 134/76  Pulse 112  Temp(Src) 98.5 F (36.9 C) (Oral)  Resp 20  Ht 5\' 2"  (1.575 m)  Wt 246 lb (111.585 kg)  BMI 44.99 kg/m2  SpO2 98%   Joya Gaskins, MD 02/14/11 2257

## 2011-02-14 NOTE — ED Provider Notes (Signed)
History     CSN: [id number] Arrival date & time: 02/14/2011  8:41 PM   First MD Initiated Contact with Patient 02/14/11 2030      Chief Complaint  Patient presents with  . Headache    (Consider location/radiation/quality/duration/timing/severity/associated sxs/prior treatment) HPI Comments: Patient is G5P2AB2 [redacted] weeks pregnant.  Denies vaginal bleeding, abd pain, edema, or fever.    Patient is a 30 y.o. female presenting with headaches. The history is provided by the patient.  Headache  This is a recurrent problem. The current episode started yesterday. The problem occurs constantly. The problem has not changed since onset.The headache is associated with bright light and loud noise. The pain is located in the frontal and temporal region. The quality of the pain is described as throbbing and dull. The pain is at a severity of 7/10. The pain does not radiate. Associated symptoms include shortness of breath and nausea. Pertinent negatives include no anorexia, no fever, no malaise/fatigue, no near-syncope, no palpitations and no vomiting. She has tried acetaminophen for the symptoms. The treatment provided no relief.    Past Medical History  Diagnosis Date  . Migraine   . Pregnant state, incidental     Past Surgical History  Procedure Date  . Back surgery   . Knee surgery     History reviewed. No pertinent family history.  History  Substance Use Topics  . Smoking status: Never Smoker   . Smokeless tobacco: Not on file  . Alcohol Use: No    OB History    Grav Para Term Preterm Abortions TAB SAB Ect Mult Living   8 2   5     2       Review of Systems  Constitutional: Negative for fever, chills, malaise/fatigue, activity change, appetite change and fatigue.  HENT: Negative for congestion, sore throat, trouble swallowing, neck pain and neck stiffness.   Eyes: Positive for photophobia. Negative for pain.  Respiratory: Positive for shortness of breath. Negative for cough,  chest tightness and wheezing.   Cardiovascular: Negative for chest pain, palpitations and near-syncope.  Gastrointestinal: Positive for nausea. Negative for vomiting, abdominal pain and anorexia.  Genitourinary: Negative for dysuria, hematuria, flank pain, vaginal bleeding, vaginal discharge, difficulty urinating, vaginal pain and pelvic pain.  Musculoskeletal: Negative for myalgias, back pain and arthralgias.  Skin: Negative for rash.  Neurological: Positive for headaches. Negative for dizziness, speech difficulty, weakness and numbness.  Hematological: Does not bruise/bleed easily.  Psychiatric/Behavioral: Negative for confusion and decreased concentration.  All other systems reviewed and are negative.    Allergies  Review of patient's allergies indicates no known allergies.  Home Medications   Current Outpatient Rx  Name Route Sig Dispense Refill  . ACETAMINOPHEN 500 MG PO TABS Oral Take 1,000 mg by mouth 2 (two) times daily as needed. For pain       BP 134/76  Pulse 112  Temp(Src) 98.5 F (36.9 C) (Oral)  Resp 20  Ht 5\' 2"  (1.575 m)  Wt 246 lb (111.585 kg)  BMI 44.99 kg/m2  SpO2 98%  Physical Exam  Nursing note and vitals reviewed. Constitutional: She is oriented to person, place, and time. She appears well-developed and well-nourished.  Non-toxic appearance. She does not have a sickly appearance. She does not appear ill. No distress.  HENT:  Head: Normocephalic and atraumatic.  Right Ear: Tympanic membrane normal.  Left Ear: Tympanic membrane normal.  Mouth/Throat: Uvula is midline, oropharynx is clear and moist and mucous membranes are normal.  Eyes:  Conjunctivae and EOM are normal. Pupils are equal, round, and reactive to light.  Neck: Trachea normal and normal range of motion. Neck supple. No spinous process tenderness and no muscular tenderness present. No Brudzinski's sign and no Kernig's sign noted. No thyromegaly present.  Cardiovascular: Normal rate, regular  rhythm and normal heart sounds.   Pulmonary/Chest: Effort normal and breath sounds normal. No respiratory distress. She exhibits no tenderness.  Abdominal: Soft. She exhibits no distension. There is no tenderness.  Musculoskeletal: Normal range of motion. She exhibits no edema and no tenderness.  Lymphadenopathy:    She has no cervical adenopathy.  Neurological: She is alert and oriented to person, place, and time. She has normal strength. No cranial nerve deficit or sensory deficit. She exhibits normal muscle tone. Coordination normal.  Reflex Scores:      Tricep reflexes are 2+ on the right side and 2+ on the left side.      Bicep reflexes are 2+ on the right side and 2+ on the left side.      Brachioradialis reflexes are 2+ on the right side and 2+ on the left side. Skin: Skin is warm and dry.    ED Course  Procedures (including critical care time)       MDM    9:13 PM patient is alert, NAD.  Non-toxic appearing.  Diffuse frontal headache, hx of migraines and states pain tonight is similar as previous headaches.  Patient is [redacted] weeks pregnant, has regular prenatal care with appt with Dr. Emelda Fear next week.  Denies fever, vomiting, abd pain, hyperreflexia, edema or vaginal bleeding.  No hypertension.   10:07 PM patient resting, feeling better, headache improved.  Patient agrees to close follow-up with Dr. Emelda Fear.   Discussed pt hx with EDP.       I have reviewed chart from previous ED visits.   Pt feels improved after observation and/or treatment in ED.   Patient / Family / Caregiver understand and agree with initial ED impression and plan with expectations set for ED visit.          Vonne Mcdanel L. [place], [place] 02/14/11 2212

## 2011-04-10 ENCOUNTER — Encounter (HOSPITAL_COMMUNITY): Payer: Self-pay

## 2011-04-10 ENCOUNTER — Emergency Department (HOSPITAL_COMMUNITY)
Admission: EM | Admit: 2011-04-10 | Discharge: 2011-04-10 | Disposition: A | Discharge status: Home / Self Care | Payer: Medicaid Other | Attending: Emergency Medicine | Admitting: Emergency Medicine

## 2011-04-10 DIAGNOSIS — R110 Nausea: Secondary | ICD-10-CM | POA: Insufficient documentation

## 2011-04-10 DIAGNOSIS — G43909 Migraine, unspecified, not intractable, without status migrainosus: Secondary | ICD-10-CM | POA: Insufficient documentation

## 2011-04-10 DIAGNOSIS — H53149 Visual discomfort, unspecified: Secondary | ICD-10-CM | POA: Insufficient documentation

## 2011-04-10 MED ORDER — DIPHENHYDRAMINE HCL 25 MG PO CAPS
25.0000 mg | ORAL_CAPSULE | Freq: Once | ORAL | Status: AC
  Administered 2011-04-10: 25 mg via ORAL
  Filled 2011-04-10: qty 1

## 2011-04-10 MED ORDER — PROMETHAZINE HCL 12.5 MG PO TABS
25.0000 mg | ORAL_TABLET | Freq: Once | ORAL | Status: AC
Start: 2011-04-10 — End: 2011-04-10
  Administered 2011-04-10: 25 mg via ORAL
  Filled 2011-04-10: qty 1

## 2011-04-10 MED ORDER — MORPHINE SULFATE 4 MG/ML IJ SOLN
8.0000 mg | Freq: Once | INTRAMUSCULAR | Status: AC
  Administered 2011-04-10: 8 mg via INTRAMUSCULAR
  Filled 2011-04-10: qty 2

## 2011-04-10 NOTE — ED Provider Notes (Signed)
History     CSN: [id number]  Arrival date & time 04/10/11  1924   First MD Initiated Contact with Patient 04/10/11 2001      Chief Complaint  Patient presents with  . Migraine    (Consider location/radiation/quality/duration/timing/severity/associated sxs/prior treatment) Patient is a 30 y.o. female presenting with migraine. The history is provided by the patient.  Migraine This is a recurrent problem. Episode onset: 2 days ago. The problem occurs constantly. The problem has been unchanged. Associated symptoms include headaches and nausea. Pertinent negatives include no abdominal pain, arthralgias, chest pain, chills, congestion, coughing, fever, joint swelling, neck pain, numbness, rash, sore throat, vomiting or weakness. Associated symptoms comments: Patient has a history of chronic intermittent migraines.  Current headache started 2 days ago  And is consistent with her typical migraine headache,  Including nausea and photophobia.  She denies emesis,  But has had decreased po intake, but has been maintaining fluid intake.  She is currently [redacted] weeks pregnant and has had no pregnancy complications.  Denies abdominal or pelvic pain,  No vaginal bleeding.  She has not had any peripheral edema.. Exacerbated by: Lights and sound makes headache worse. She has tried acetaminophen and rest for the symptoms. The treatment provided no relief.    Past Medical History  Diagnosis Date  . Migraine   . Pregnant state, incidental     Past Surgical History  Procedure Date  . Back surgery   . Knee surgery     No family history on file.  History  Substance Use Topics  . Smoking status: Never Smoker   . Smokeless tobacco: Not on file  . Alcohol Use: No    OB History    Grav Para Term Preterm Abortions TAB SAB Ect Mult Living   9 2   5     2       Review of Systems  Constitutional: Negative for fever and chills.  HENT: Negative for congestion, sore throat and neck pain.   Eyes: Positive  for photophobia.  Respiratory: Negative for cough, chest tightness and shortness of breath.   Cardiovascular: Negative for chest pain.  Gastrointestinal: Positive for nausea. Negative for vomiting and abdominal pain.  Genitourinary: Negative.   Musculoskeletal: Negative for joint swelling and arthralgias.  Skin: Negative.  Negative for rash and wound.  Neurological: Positive for headaches. Negative for dizziness, weakness, light-headedness and numbness.  Hematological: Negative.   Psychiatric/Behavioral: Negative.     Allergies  Review of patient's allergies indicates no known allergies.  Home Medications   Current Outpatient Rx  Name Route Sig Dispense Refill  . ACETAMINOPHEN 500 MG PO TABS Oral Take 1,000 mg by mouth 2 (two) times daily as needed. For pain       BP 109/78  Pulse 87  Temp(Src) 98.2 F (36.8 C) (Oral)  Resp 22  Ht 5\' 2"  (1.575 m)  Wt 248 lb (112.492 kg)  BMI 45.36 kg/m2  SpO2 99%  LMP 10/29/2010  Physical Exam  Nursing note and vitals reviewed. Constitutional: She is oriented to person, place, and time. She appears well-developed and well-nourished.       Uncomfortable appearing  HENT:  Head: Normocephalic and atraumatic.  Mouth/Throat: Oropharynx is clear and moist.  Eyes: EOM are normal. Pupils are equal, round, and reactive to light.  Neck: Normal range of motion. Neck supple.  Cardiovascular: Normal rate, normal heart sounds and intact distal pulses.        No ankle edema.  Pulmonary/Chest: Effort normal.  Abdominal: Soft. There is no tenderness.  Musculoskeletal: Normal range of motion.  Lymphadenopathy:    She has no cervical adenopathy.  Neurological: She is alert and oriented to person, place, and time. She has normal strength. No cranial nerve deficit or sensory deficit. She displays a negative Romberg sign. Gait normal. GCS eye subscore is 4. GCS verbal subscore is 5. GCS motor subscore is 6.       Normal heel-shin, normal rapid alternating  movements.  Skin: Skin is warm and dry. No rash noted.  Psychiatric: She has a normal mood and affect. Her speech is normal and behavior is normal. Thought content normal. Cognition and memory are normal.    ED Course  Procedures (including critical care time)  Labs Reviewed - No data to display No results found.   1. Migraine      Fetal heart tones 158.  Morphine 8 mg IM,  Benadryl 25 mg PO,  Phenergan 25 mg PO given.  Patient obtained complete relief of headache  MDM  Simple migraine.  Patient with normal vital signs,  No peripheral edema to suggest pre-eclampsia picture.  Pt pain free at time of dc.  No neuro deficits on exam.        Candis Musa, PA 04/11/11 1211

## 2011-04-10 NOTE — ED Notes (Signed)
Pt reporting complete relief of headache pain.  No additional complaints at present time.

## 2011-04-10 NOTE — ED Notes (Addendum)
Pt presents with migraine. Pt states pain is of typical migraine. Pt reports nausea, light and noise sensitivity, and blurred vision. Pt states symptoms started 2 days ago. Pt reports that she is [redacted] weeks pregnant.

## 2011-04-10 NOTE — ED Notes (Signed)
Fetal heart tones regular and even.  Rate 158

## 2011-04-11 NOTE — L&D Delivery Note (Cosign Needed)
Delivery Note At 11:36 AM a viable and healthy female was delivered via Vaginal, Spontaneous Delivery (Presentation: Left Occiput Anterior).  APGAR: 7, 9; weight .   Placenta status: Intact, Spontaneous.  Cord: 3 vessels with the following complications: None.  Cord pH: not indicated. One loose loop nuchal cord reduced.  Anesthesia: Epidural  Episiotomy: None Lacerations: None Suture Repair: n/a Est. Blood Loss (mL): 250  Mom to postpartum.  Baby to nursery-stable.   D. [place] Sherron Flemings Paz. MD PGY-1  05/09/2011, 11:50 AM

## 2011-04-12 NOTE — ED Provider Notes (Signed)
Medical screening examination/treatment/procedure(s) were performed by non-physician practitioner and as supervising physician I was immediately available for consultation/collaboration.  Natasja Niday S. Destyne Goodreau, MD 04/12/11 1343 

## 2011-05-08 ENCOUNTER — Encounter (HOSPITAL_COMMUNITY): Payer: Self-pay | Admitting: *Deleted

## 2011-05-08 ENCOUNTER — Other Ambulatory Visit: Payer: Self-pay | Admitting: Obstetrics and Gynecology

## 2011-05-08 ENCOUNTER — Inpatient Hospital Stay (HOSPITAL_COMMUNITY)
Admission: AD | Admit: 2011-05-08 | Discharge: 2011-05-11 | DRG: 767 | Disposition: A | Discharge status: Home / Self Care | Payer: Medicaid Other | Source: Ambulatory Visit | Attending: Obstetrics & Gynecology | Admitting: Obstetrics & Gynecology

## 2011-05-08 DIAGNOSIS — Z9289 Personal history of other medical treatment: Secondary | ICD-10-CM

## 2011-05-08 DIAGNOSIS — Z302 Encounter for sterilization: Secondary | ICD-10-CM

## 2011-05-08 DIAGNOSIS — Z3490 Encounter for supervision of normal pregnancy, unspecified, unspecified trimester: Secondary | ICD-10-CM

## 2011-05-08 DIAGNOSIS — O4100X0 Oligohydramnios, unspecified trimester, not applicable or unspecified: Principal | ICD-10-CM | POA: Diagnosis present

## 2011-05-08 LAB — ABO/RH: RH Type: POSITIVE

## 2011-05-08 LAB — CBC
HCT: 34 % — ABNORMAL LOW (ref 36.0–46.0)
Hemoglobin: 11.3 g/dL — ABNORMAL LOW (ref 12.0–15.0)
MCHC: 33.2 g/dL (ref 30.0–36.0)
RBC: 3.93 MIL/uL (ref 3.87–5.11)
WBC: 7.3 10*3/uL (ref 4.0–10.5)

## 2011-05-08 LAB — ANTIBODY SCREEN: Antibody Screen: NEGATIVE

## 2011-05-08 LAB — HIV ANTIBODY (ROUTINE TESTING W REFLEX)
HIV: NONREACTIVE
HIV: NONREACTIVE

## 2011-05-08 LAB — RPR: RPR: NONREACTIVE

## 2011-05-08 LAB — STREP B DNA PROBE: GBS: POSITIVE

## 2011-05-08 MED ORDER — IBUPROFEN 600 MG PO TABS: 600.0000 mg | ORAL_TABLET | Freq: Four times a day (QID) | ORAL | Status: DC | PRN

## 2011-05-08 MED ORDER — LACTATED RINGERS IV SOLN: INTRAVENOUS | Status: DC

## 2011-05-08 MED ORDER — TERBUTALINE SULFATE 1 MG/ML IJ SOLN: 0.2500 mg | Freq: Once | INTRAMUSCULAR | Status: AC | PRN

## 2011-05-08 MED ORDER — OXYCODONE-ACETAMINOPHEN 5-325 MG PO TABS: 1.0000 | ORAL_TABLET | ORAL | Status: DC | PRN

## 2011-05-08 MED ORDER — OXYTOCIN 20 UNITS IN LACTATED RINGERS INFUSION - SIMPLE: 125.0000 mL/h | Freq: Once | INTRAVENOUS | Status: DC

## 2011-05-08 MED ORDER — LACTATED RINGERS IV SOLN: 500.0000 mL | INTRAVENOUS | Status: DC | PRN

## 2011-05-08 MED ORDER — PENICILLIN G POTASSIUM 5000000 UNITS IJ SOLR
2.5000 10*6.[IU] | INTRAVENOUS | Status: DC
  Administered 2011-05-09 (×3): 2.5 10*6.[IU] via INTRAVENOUS
  Filled 2011-05-08 (×7): qty 2.5

## 2011-05-08 MED ORDER — OXYTOCIN 20 UNITS IN LACTATED RINGERS INFUSION - SIMPLE
1.0000 m[IU]/min | INTRAVENOUS | Status: DC
  Administered 2011-05-08: 2 m[IU]/min via INTRAVENOUS
  Administered 2011-05-09: 333 m[IU]/min via INTRAVENOUS
  Administered 2011-05-09: 2 m[IU]/min via INTRAVENOUS

## 2011-05-08 MED ORDER — NALBUPHINE SYRINGE 5 MG/0.5 ML
5.0000 mg | INJECTION | INTRAMUSCULAR | Status: DC | PRN
  Administered 2011-05-09 (×2): 5 mg via INTRAVENOUS
  Filled 2011-05-08 (×2): qty 0.5

## 2011-05-08 MED ORDER — FLEET ENEMA 7-19 GM/118ML RE ENEM: 1.0000 | ENEMA | RECTAL | Status: DC | PRN

## 2011-05-08 MED ORDER — OXYTOCIN BOLUS FROM INFUSION
500.0000 mL | Freq: Once | INTRAVENOUS | Status: DC
  Filled 2011-05-08: qty 1000
  Filled 2011-05-08: qty 500

## 2011-05-08 MED ORDER — ONDANSETRON HCL 4 MG/2ML IJ SOLN
4.0000 mg | Freq: Four times a day (QID) | INTRAMUSCULAR | Status: DC | PRN
  Administered 2011-05-09 (×2): 4 mg via INTRAVENOUS
  Filled 2011-05-08 (×2): qty 2

## 2011-05-08 MED ORDER — CITRIC ACID-SODIUM CITRATE 334-500 MG/5ML PO SOLN: 30.0000 mL | ORAL | Status: DC | PRN

## 2011-05-08 MED ORDER — OXYTOCIN 20 UNITS IN LACTATED RINGERS INFUSION - SIMPLE: 1.0000 m[IU]/min | INTRAVENOUS | Status: DC

## 2011-05-08 MED ORDER — PENICILLIN G POTASSIUM 5000000 UNITS IJ SOLR
5.0000 10*6.[IU] | Freq: Once | INTRAVENOUS | Status: AC
  Administered 2011-05-08: 5 10*6.[IU] via INTRAVENOUS
  Filled 2011-05-08: qty 5

## 2011-05-08 MED ORDER — ACETAMINOPHEN 325 MG PO TABS
650.0000 mg | ORAL_TABLET | ORAL | Status: DC | PRN
  Administered 2011-05-08: 650 mg via ORAL
  Filled 2011-05-08: qty 2

## 2011-05-08 MED ORDER — LIDOCAINE HCL (PF) 1 % IJ SOLN
30.0000 mL | INTRAMUSCULAR | Status: DC | PRN
  Filled 2011-05-08: qty 30

## 2011-05-08 NOTE — H&P (Signed)
Agree with note. 

## 2011-05-08 NOTE — H&P (Signed)
FHR reactive with good accels.  Several very small variable decels.  Cervix FT/40%/-3/vtx/very soft.  Will start Pitocin since cervix is so soft.

## 2011-05-08 NOTE — H&P (Signed)
  Michelle Mcdowell is a 31 y.o. female presenting for induction of labor due to oligohydramnios and biophysical profile of 4/10 This 13 year [id number] [facility] is admitted after presenting for office visit with complaints of decreased fetal movement. Nonstress test was reactive without decelerations blood pressures were noted at 138/86 recheck at 142/101 50/90 with 1+ proteinuria on urine sample. CBC and Cmet ordered showed normal platelets and normal liver function tests. Ultrasound has now shown marked oligohydramnios with AFI of 1.6 and a biophysical profile positive only for breathing movement, 2/8. Cervix is 1 cm long -2 vertex presentation speculum exam shows no suggestion of membrane rupture. Will present promptly to labor and delivery and understands the importance of prompt travel to the hospital.  Prior OB history notable for significant sciatica in the right hip and buttock over the last week, for which she has been  On hydrocodone 5 mg every 6 hours as been no bleeding or suspicion of pregnancy-related problems an ultrasound at 37 weeks showed estimated fetal weight 5 lbs. 11 oz., 16th percentile with low normal fluid volume. 2 days. Estimated fetal weight was 6 pounds today.    History OB History    Grav Para Term Preterm Abortions TAB SAB Ect Mult Living   9 2   5     2      Past Medical History  Diagnosis Date  . Migraine   . Pregnant state, incidental    Past Surgical History  Procedure Date  . Back surgery   . Knee surgery    Family History: family history is not on file. Social History:  reports that she has never smoked. She does not have any smokeless tobacco history on file. She reports that she does not drink alcohol or use illicit drugs.  ROS no bleeding trauma, or injury .    Last menstrual period 10/29/2010. patient was unsure of her menses at the time of initial prenatal care Ultrasound dating 7 weeks 2 days on 621 suggesting EDC 05/16/2011    Exam Physical Exam  Prenatal labs: ABO, Rh:   a positive Antibody:   negative Rubella:   immune RPR:   negative HBsAg:   nonreactive HIV:   negative GBS:   positive 04/20/2011  Assessment/Plan: Pregnancy 38 week 6 day, with small for gestational age infant, oligohydramnios, biophysical profile 4/10 Rule out preeclampsia Chronic back pain status post disc herniation and surgery Plan: Direct admit to labor and delivery cautious induction with Pitocin. Protein to creatinine ratio ordered. Continuous monitoring, to assess fetal tolerance of labor Case discussed with Dr. Dutch Gray V 05/08/2011, 3:28 PM

## 2011-05-08 NOTE — Progress Notes (Addendum)
Michelle Mcdowell is a 31 y.o. [id number] at [redacted]w[redacted]d   Subjective: Mild UC's  Objective: BP 124/75  Pulse 99  Temp(Src) 98 F (36.7 C) (Oral)  Resp 18  Ht 5\' 2"  (1.575 m)  Wt 112.492 kg (248 lb)  BMI 45.36 kg/m2  LMP 10/29/2010  Temp:  [97.9 F (36.6 C)-98 F (36.7 C)] 98 F (36.7 C) (01/28 1947) Pulse Rate:  [91-110] 99  (01/28 2202) Resp:  [18-20] 18  (01/28 2202) BP: (112-137)/(69-88) 124/75 mmHg (01/28 2202) Weight:  [112.492 kg (248 lb)] 112.492 kg (248 lb) (01/28 1816)     FHT:  FHR: 130 bpm, variability: moderate,  accelerations:  Present,  decelerations:  Absent UC:   regular, every 3-5 minutes, mild SVE:   Dilation: 1.5 Effacement (%): 40 Station: -3 Exam by:: Michelle Mcdowell, CNM   Labs: Lab Results  Component Value Date   WBC 7.3 05/08/2011   HGB 11.3* 05/08/2011   HCT 34.0* 05/08/2011   MCV 86.5 05/08/2011   PLT 287 05/08/2011    Assessment / Plan: Induction of labor due to oligohydramnios,  progressing well on pitocin Mildly elevated BP.   Labor: Progressing on Pitocin, will continue to increase then AROM Preeclampsia:  NA Fetal Wellbeing:  Category I Pain Control:  none I/D:  n/a Anticipated MOD:  NSVD Protein creatinine ratio ordered, but pt has not left specimen yet  Michelle Mcdowell 05/08/2011, 10:25 PM

## 2011-05-09 ENCOUNTER — Encounter (HOSPITAL_COMMUNITY): Payer: Self-pay | Admitting: *Deleted

## 2011-05-09 ENCOUNTER — Encounter (HOSPITAL_COMMUNITY): Payer: Self-pay | Admitting: Anesthesiology

## 2011-05-09 ENCOUNTER — Encounter (HOSPITAL_COMMUNITY)
Admission: AD | Disposition: A | Discharge status: Home / Self Care | Payer: Self-pay | Source: Ambulatory Visit | Attending: Obstetrics & Gynecology

## 2011-05-09 ENCOUNTER — Inpatient Hospital Stay (HOSPITAL_COMMUNITY): Payer: Medicaid Other | Admitting: Anesthesiology

## 2011-05-09 ENCOUNTER — Other Ambulatory Visit: Payer: Self-pay | Admitting: Obstetrics and Gynecology

## 2011-05-09 DIAGNOSIS — O4100X0 Oligohydramnios, unspecified trimester, not applicable or unspecified: Secondary | ICD-10-CM

## 2011-05-09 DIAGNOSIS — Z302 Encounter for sterilization: Secondary | ICD-10-CM

## 2011-05-09 HISTORY — PX: TUBAL LIGATION: SHX77

## 2011-05-09 LAB — CBC
HCT: 35.5 % — ABNORMAL LOW (ref 36.0–46.0)
RDW: 13.2 % (ref 11.5–15.5)
WBC: 13.5 10*3/uL — ABNORMAL HIGH (ref 4.0–10.5)

## 2011-05-09 SURGERY — LIGATION, FALLOPIAN TUBE, POSTPARTUM
Anesthesia: Epidural

## 2011-05-09 MED ORDER — FENTANYL CITRATE 0.05 MG/ML IJ SOLN
INTRAMUSCULAR | Status: DC | PRN
  Administered 2011-05-09: 50 ug via INTRAVENOUS

## 2011-05-09 MED ORDER — PHENYLEPHRINE 40 MCG/ML (10ML) SYRINGE FOR IV PUSH (FOR BLOOD PRESSURE SUPPORT)
80.0000 ug | PREFILLED_SYRINGE | INTRAVENOUS | Status: DC | PRN
  Filled 2011-05-09: qty 5

## 2011-05-09 MED ORDER — ONDANSETRON HCL 4 MG/2ML IJ SOLN
INTRAMUSCULAR | Status: DC | PRN
  Administered 2011-05-09: 4 mg via INTRAVENOUS

## 2011-05-09 MED ORDER — MIDAZOLAM HCL 5 MG/5ML IJ SOLN
INTRAMUSCULAR | Status: DC | PRN
  Administered 2011-05-09: 2 mg via INTRAVENOUS

## 2011-05-09 MED ORDER — WITCH HAZEL-GLYCERIN EX PADS: 1.0000 "application " | MEDICATED_PAD | CUTANEOUS | Status: DC | PRN

## 2011-05-09 MED ORDER — CITRIC ACID-SODIUM CITRATE 334-500 MG/5ML PO SOLN
30.0000 mL | Freq: Once | ORAL | Status: AC
  Administered 2011-05-09: 30 mL via ORAL
  Filled 2011-05-09: qty 15

## 2011-05-09 MED ORDER — SODIUM BICARBONATE 8.4 % IV SOLN
INTRAVENOUS | Status: AC
  Filled 2011-05-09: qty 50

## 2011-05-09 MED ORDER — OXYCODONE-ACETAMINOPHEN 5-325 MG PO TABS
1.0000 | ORAL_TABLET | ORAL | Status: DC | PRN
  Administered 2011-05-09 (×2): 1 via ORAL
  Administered 2011-05-10 (×3): 2 via ORAL
  Filled 2011-05-09: qty 1
  Filled 2011-05-09 (×3): qty 2

## 2011-05-09 MED ORDER — MIDAZOLAM HCL 2 MG/2ML IJ SOLN
INTRAMUSCULAR | Status: AC
  Filled 2011-05-09: qty 2

## 2011-05-09 MED ORDER — BUPIVACAINE HCL (PF) 0.25 % IJ SOLN
INTRAMUSCULAR | Status: AC
  Filled 2011-05-09: qty 30

## 2011-05-09 MED ORDER — SODIUM BICARBONATE 8.4 % IV SOLN
INTRAVENOUS | Status: DC | PRN
  Administered 2011-05-09 (×2): 5 mL via EPIDURAL

## 2011-05-09 MED ORDER — LIDOCAINE HCL 1.5 % IJ SOLN
INTRAMUSCULAR | Status: DC | PRN
  Administered 2011-05-09 (×2): 4 mL via EPIDURAL

## 2011-05-09 MED ORDER — BENZOCAINE-MENTHOL 20-0.5 % EX AERO: 1.0000 "application " | INHALATION_SPRAY | CUTANEOUS | Status: DC | PRN

## 2011-05-09 MED ORDER — DIPHENHYDRAMINE HCL 50 MG/ML IJ SOLN: 12.5000 mg | INTRAMUSCULAR | Status: DC | PRN

## 2011-05-09 MED ORDER — LACTATED RINGERS IV SOLN: 500.0000 mL | Freq: Once | INTRAVENOUS | Status: DC

## 2011-05-09 MED ORDER — PRENATAL MULTIVITAMIN CH
1.0000 | ORAL_TABLET | Freq: Every day | ORAL | Status: DC
  Administered 2011-05-09 – 2011-05-11 (×4): 1 via ORAL
  Filled 2011-05-09 (×3): qty 1

## 2011-05-09 MED ORDER — ONDANSETRON HCL 4 MG/2ML IJ SOLN: 4.0000 mg | INTRAMUSCULAR | Status: DC | PRN

## 2011-05-09 MED ORDER — DIBUCAINE 1 % RE OINT: 1.0000 "application " | TOPICAL_OINTMENT | RECTAL | Status: DC | PRN

## 2011-05-09 MED ORDER — LIDOCAINE-EPINEPHRINE (PF) 2 %-1:200000 IJ SOLN
INTRAMUSCULAR | Status: AC
  Filled 2011-05-09: qty 20

## 2011-05-09 MED ORDER — FAMOTIDINE 20 MG PO TABS
20.0000 mg | ORAL_TABLET | Freq: Every day | ORAL | Status: AC
Start: 2011-05-09 — End: 2011-05-09
  Administered 2011-05-09: 20 mg via ORAL
  Filled 2011-05-09: qty 1

## 2011-05-09 MED ORDER — ZOLPIDEM TARTRATE 5 MG PO TABS: 5.0000 mg | ORAL_TABLET | Freq: Every evening | ORAL | Status: DC | PRN

## 2011-05-09 MED ORDER — DIPHENHYDRAMINE HCL 25 MG PO CAPS: 25.0000 mg | ORAL_CAPSULE | Freq: Four times a day (QID) | ORAL | Status: DC | PRN

## 2011-05-09 MED ORDER — OXYCODONE-ACETAMINOPHEN 5-325 MG PO TABS
1.0000 | ORAL_TABLET | Freq: Four times a day (QID) | ORAL | Status: DC | PRN
  Administered 2011-05-10: 1 via ORAL
  Filled 2011-05-09: qty 2
  Filled 2011-05-09: qty 1

## 2011-05-09 MED ORDER — FENTANYL 2.5 MCG/ML BUPIVACAINE 1/10 % EPIDURAL INFUSION (WH - ANES)
INTRAMUSCULAR | Status: DC | PRN
  Administered 2011-05-09: 14 mL/h via EPIDURAL

## 2011-05-09 MED ORDER — IBUPROFEN 600 MG PO TABS
600.0000 mg | ORAL_TABLET | Freq: Four times a day (QID) | ORAL | Status: DC | PRN
  Administered 2011-05-11: 600 mg via ORAL

## 2011-05-09 MED ORDER — FENTANYL CITRATE 0.05 MG/ML IJ SOLN
INTRAMUSCULAR | Status: AC
  Filled 2011-05-09: qty 2

## 2011-05-09 MED ORDER — LANOLIN HYDROUS EX OINT: TOPICAL_OINTMENT | CUTANEOUS | Status: DC | PRN

## 2011-05-09 MED ORDER — IBUPROFEN 600 MG PO TABS
600.0000 mg | ORAL_TABLET | Freq: Four times a day (QID) | ORAL | Status: DC
  Administered 2011-05-09 – 2011-05-11 (×7): 600 mg via ORAL
  Filled 2011-05-09 (×7): qty 1

## 2011-05-09 MED ORDER — TETANUS-DIPHTH-ACELL PERTUSSIS 5-2.5-18.5 LF-MCG/0.5 IM SUSP
0.5000 mL | Freq: Once | INTRAMUSCULAR | Status: AC
  Administered 2011-05-10: 0.5 mL via INTRAMUSCULAR
  Filled 2011-05-09: qty 0.5

## 2011-05-09 MED ORDER — EPHEDRINE 5 MG/ML INJ: 10.0000 mg | INTRAVENOUS | Status: DC | PRN

## 2011-05-09 MED ORDER — SENNOSIDES-DOCUSATE SODIUM 8.6-50 MG PO TABS
2.0000 | ORAL_TABLET | Freq: Every day | ORAL | Status: DC
  Administered 2011-05-09 – 2011-05-10 (×2): 2 via ORAL

## 2011-05-09 MED ORDER — ACETAMINOPHEN 500 MG PO TABS: 1000.0000 mg | ORAL_TABLET | Freq: Four times a day (QID) | ORAL | Status: DC | PRN

## 2011-05-09 MED ORDER — EPHEDRINE 5 MG/ML INJ
10.0000 mg | INTRAVENOUS | Status: DC | PRN
  Filled 2011-05-09: qty 4

## 2011-05-09 MED ORDER — ONDANSETRON HCL 4 MG/2ML IJ SOLN
INTRAMUSCULAR | Status: AC
  Filled 2011-05-09: qty 2

## 2011-05-09 MED ORDER — FENTANYL 2.5 MCG/ML BUPIVACAINE 1/10 % EPIDURAL INFUSION (WH - ANES)
14.0000 mL/h | INTRAMUSCULAR | Status: DC
  Administered 2011-05-09: 14 mL/h via EPIDURAL
  Filled 2011-05-09 (×3): qty 60

## 2011-05-09 MED ORDER — SIMETHICONE 80 MG PO CHEW
80.0000 mg | CHEWABLE_TABLET | ORAL | Status: DC | PRN
  Administered 2011-05-09: 80 mg via ORAL

## 2011-05-09 MED ORDER — PHENYLEPHRINE 40 MCG/ML (10ML) SYRINGE FOR IV PUSH (FOR BLOOD PRESSURE SUPPORT): 80.0000 ug | PREFILLED_SYRINGE | INTRAVENOUS | Status: DC | PRN

## 2011-05-09 MED ORDER — ONDANSETRON HCL 4 MG PO TABS: 4.0000 mg | ORAL_TABLET | ORAL | Status: DC | PRN

## 2011-05-09 MED ORDER — BUPIVACAINE HCL (PF) 0.25 % IJ SOLN
INTRAMUSCULAR | Status: DC | PRN
  Administered 2011-05-09: 30 mL

## 2011-05-09 MED ORDER — LACTATED RINGERS IV SOLN
INTRAVENOUS | Status: DC | PRN
  Administered 2011-05-09 (×3): via INTRAVENOUS

## 2011-05-09 SURGICAL SUPPLY — 18 items
CHLORAPREP W/TINT 26ML (MISCELLANEOUS) ×2 IMPLANT
CONTAINER PREFILL 10% NBF 15ML (MISCELLANEOUS) ×4 IMPLANT
GLOVE BIOGEL PI IND STRL 6.5 (GLOVE) ×2 IMPLANT
GLOVE BIOGEL PI INDICATOR 6.5 (GLOVE) ×2
GLOVE SURG SS PI 6.0 STRL IVOR (GLOVE) ×2 IMPLANT
GOWN PREVENTION PLUS LG XLONG (DISPOSABLE) ×4 IMPLANT
NEEDLE HYPO 25X1 1.5 SAFETY (NEEDLE) IMPLANT
NS IRRIG 1000ML POUR BTL (IV SOLUTION) ×2 IMPLANT
PACK ABDOMINAL MINOR (CUSTOM PROCEDURE TRAY) ×2 IMPLANT
SPONGE LAP 4X18 X RAY DECT (DISPOSABLE) IMPLANT
SUT PLAIN 0 NONE (SUTURE) ×2 IMPLANT
SUT VIC AB 0 CT1 27 (SUTURE) ×1
SUT VIC AB 0 CT1 27XBRD ANBCTR (SUTURE) ×1 IMPLANT
SUT VIC AB 3-0 PS2 18 (SUTURE) ×2 IMPLANT
SYR CONTROL 10ML LL (SYRINGE) IMPLANT
TOWEL OR 17X24 6PK STRL BLUE (TOWEL DISPOSABLE) ×4 IMPLANT
TRAY FOLEY CATH 14FR (SET/KITS/TRAYS/PACK) ×2 IMPLANT
WATER STERILE IRR 1000ML POUR (IV SOLUTION) ×2 IMPLANT

## 2011-05-09 NOTE — Addendum Note (Signed)
Addendum  created 05/09/11 1738 by Truitt Leep, CRNA   Modules edited:Charges VN, Notes Section

## 2011-05-09 NOTE — Progress Notes (Signed)
Michelle Mcdowell is a 31 y.o. [id number] at [redacted]w[redacted]d admitted for induction of labor due to oligo, BPP 4/10.  Subjective: Uncomfortable w/ uc's, 1st dose of nubain worked well, requesting 2nd dose.    Objective: BP 144/91   Pulse 67   Temp(Src) 98 F (36.7 C) (Oral)   Resp 18   Ht 5\' 2"  (1.575 m)   Wt 112.492 kg (248 lb)   BMI 45.36 kg/m2   LMP 10/29/2010      FHT:  FHR: 115 bpm, variability: mostly minimal, some periods of moderate,  accelerations:  Present,  decelerations:  Absent UC:   regular, every 1-4 minutes SVE:   Dilation: 4 Effacement (%): 50 Station: -3 Exam by:: Penley, RN  KBooker, SNM Posterior, + scalp stim  Pitocin @ 69mu/min  Labs: Lab Results  Component Value Date   WBC 7.3 05/08/2011   HGB 11.3* 05/08/2011   HCT 34.0* 05/08/2011   MCV 86.5 05/08/2011   PLT 287 05/08/2011    Assessment / Plan: Induction of labor due to oligo, BPP 4/10,  progressing well on pitocin  Labor: Progressing on Pitocin, will continue to increase then AROM Preeclampsia:  n/a Fetal Wellbeing:  Category II Pain Control:  nubain I/D:  n/a Anticipated MOD:  NSVD  Joellyn Haff, SNM 05/09/2011, 4:00 AM

## 2011-05-09 NOTE — Anesthesia Postprocedure Evaluation (Signed)
Anesthesia Post Note  Patient: Michelle Mcdowell  Procedure(s) Performed:  POST PARTUM TUBAL LIGATION  Anesthesia type: Epidural  Patient location: PACU  Post pain: Pain level controlled  Post assessment: Post-op Vital signs reviewed  Last Vitals:  Filed Vitals:   05/09/11 1400  BP: 118/57  Pulse: 89  Temp:   Resp: 13    Post vital signs: Reviewed  Level of consciousness: awake  Complications: No apparent anesthesia complications

## 2011-05-09 NOTE — Progress Notes (Signed)
Pt called out stating she felt like her chest was feeling heavy.  Pt reassured of oxygen saturation and explained epidural heaviness.  Pt expressed understanding.

## 2011-05-09 NOTE — Progress Notes (Signed)
I was present for the exam and agree with above.  [name] 05/09/2011 10:27 AM

## 2011-05-09 NOTE — Transfer of Care (Signed)
Immediate Anesthesia Transfer of Care Note  Patient: Michelle Mcdowell  Procedure(s) Performed:  POST PARTUM TUBAL LIGATION  Patient Location: PACU  Anesthesia Type: Epidural  Level of Consciousness: awake, alert , oriented and patient cooperative  Airway & Oxygen Therapy: Patient Spontanous Breathing  Post-op Assessment: Report given to PACU RN and Post -op Vital signs reviewed and stable  Post vital signs: Reviewed and stable  Complications: No apparent anesthesia complications

## 2011-05-09 NOTE — Progress Notes (Signed)
Michelle Mcdowell is a 31 y.o. [id number] at [redacted]w[redacted]d admitted for induction of labor due to oligo & BPP 4/10.  Subjective: Very uncomfortable s/p epidural, feeling lots of pelvic pressure.   Objective: BP 112/64   Pulse 110   Temp(Src) 98.2 F (36.8 C) (Oral)   Resp 18   Ht 5\' 2"  (1.575 m)   Wt 112.492 kg (248 lb)   BMI 45.36 kg/m2   LMP 10/29/2010      FHT:  FHR: 130 bpm, variability: minimal ,  accelerations:  Abscent,  decelerations:  Present ? earlies- unable to determine adequately d/t uc's not tracing UC:   regular, every 1.5-2 minutes by palpation SVE:  5/70/-2, posterior kbooker, snm  Pitocin @ 35mu/min, cut in 1/2 by RN d/t tachysystole  Labs: Lab Results  Component Value Date   WBC 13.5* 05/09/2011   HGB 11.6* 05/09/2011   HCT 35.5* 05/09/2011   MCV 87.2 05/09/2011   PLT 270 05/09/2011    Assessment / Plan: IOL for oligo & BPP 4/10, progressing slowly on pitocin RN to hit epidural PCEA per protocol to alleviate pain  Labor: progressing slowly on pitocin, to leave at 74mu/min for now Preeclampsia:  no s/s Fetal Wellbeing:  Category II Pain Control:  Epidural I/D:  n/a Anticipated MOD:  NSVD  Joellyn Haff, SNM 05/09/2011, 9:31 AM

## 2011-05-09 NOTE — Anesthesia Procedure Notes (Addendum)
Epidural Patient location during procedure: OB Start time: 05/09/2011 6:04 AM  Staffing Anesthesiologist: Malen Gauze, MICHAEL A. Performed by: anesthesiologist   Preanesthetic Checklist Completed: patient identified, site marked, surgical consent, pre-op evaluation, timeout performed, IV checked, risks and benefits discussed and monitors and equipment checked  Epidural Patient position: sitting Prep: site prepped and draped and DuraPrep Patient monitoring: continuous pulse ox and blood pressure Approach: midline Injection technique: LOR air  Needle:  Needle type: Tuohy  Needle gauge: 17 G Needle length: 9 cm Needle insertion depth: 8 cm Catheter type: closed end flexible Catheter size: 19 Gauge Catheter at skin depth: 13 cm Test dose: negative and 1.5% lidocaine  Assessment Events: blood not aspirated, injection not painful, no injection resistance, negative IV test and no paresthesia  Additional Notes Patient identified. Risks and benefits discussed including failed block, incomplete  Pain control, post dural puncture headache, nerve damage, paralysis, blood pressure Changes, nausea, vomiting, reactions to medications-both toxic and allergic and post Partum back pain. All questions were answered. Patient expressed understanding and wished to proceed. Sterile technique was used throughout procedure. Epidural site was Dressed with sterile barrier dressing. No paresthesias, signs of intravascular injection Or signs of intrathecal spread were encountered. Difficult due to morbid obesity and poor landmarks. Attempts x 3. Patient was more comfortable after the epidural was dosed. Please see RN's note for documentation of vital signs and FHR which are stable.   Epidural Patient location during procedure: OB Start time: 05/09/2011 7:19 AM  Staffing Anesthesiologist: Brayton Caves R Performed by: anesthesiologist   Preanesthetic Checklist Completed: patient identified, site  marked, surgical consent, pre-op evaluation, timeout performed, IV checked, risks and benefits discussed and monitors and equipment checked  Epidural Patient position: sitting Prep: site prepped and draped and DuraPrep Patient monitoring: continuous pulse ox and blood pressure Approach: midline Injection technique: LOR air and LOR saline  Needle:  Needle type: Tuohy  Needle gauge: 17 G Needle length: 9 cm Needle insertion depth: 9 cm Catheter type: closed end flexible Catheter size: 19 Gauge Catheter at skin depth: 14 cm Test dose: negative  Assessment Events: blood not aspirated, injection not painful, no injection resistance, negative IV test and no paresthesia  Additional Notes Patient identified.  Risk benefits discussed including failed block, incomplete pain control, headache, nerve damage, paralysis, blood pressure changes, nausea, vomiting, reactions to medication both toxic or allergic, and postpartum back pain.  Patient expressed understanding and wished to proceed.  All questions were answered.  Sterile technique used throughout procedure and epidural site dressed with sterile barrier dressing. No paresthesia or other complications noted.The patient did not experience any signs of intravascular injection such as tinnitus or metallic taste in mouth nor signs of intrathecal spread such as rapid motor block. Please see nursing notes for vital signs.

## 2011-05-09 NOTE — Progress Notes (Signed)
I was present for the exam and agree with above.  Michelle Mcdowell 05/09/2011 5:23 AM

## 2011-05-09 NOTE — Preoperative (Signed)
Beta Blockers   Reason not to administer Beta Blockers:Not Applicable 

## 2011-05-09 NOTE — Anesthesia Postprocedure Evaluation (Signed)
  Anesthesia Post-op Note  Patient: Michelle Mcdowell  Procedure(s) Performed:  POST PARTUM TUBAL LIGATION  Patient Location: PACU and Mother/Baby  Anesthesia Type: Epidural  Level of Consciousness: awake, alert  and oriented  Airway and Oxygen Therapy: Patient Spontanous Breathing  Post-op Pain: mild  Post-op Assessment: Patient's Cardiovascular Status Stable and Respiratory Function Stable  Post-op Vital Signs: stable  Complications: No apparent anesthesia complications

## 2011-05-09 NOTE — Progress Notes (Signed)
I was present for the exam and agree with above.  [name] 05/09/2011 5:22 AM

## 2011-05-09 NOTE — Anesthesia Preprocedure Evaluation (Addendum)
Anesthesia Evaluation  Patient identified by MRN, date of birth, ID band Patient awake    Reviewed: Allergy & Precautions, H&P , Patient's Chart, lab work & pertinent test results  Airway Mallampati: III TM Distance: >3 FB Neck ROM: full    Dental No notable dental hx.    Pulmonary neg pulmonary ROS,  clear to auscultation  Pulmonary exam normal       Cardiovascular neg cardio ROS regular Normal    Neuro/Psych  Headaches, Negative Psych ROS   GI/Hepatic negative GI ROS, Neg liver ROS,   Endo/Other  Morbid obesity  Renal/GU negative Renal ROS  Genitourinary negative   Musculoskeletal   Abdominal Normal abdominal exam  (+)   Peds  Hematology negative hematology ROS (+)   Anesthesia Other Findings Pierced tight upper lip and tongue  Reproductive/Obstetrics (+) Pregnancy                          Anesthesia Physical Anesthesia Plan  ASA: III  Anesthesia Plan: Epidural   Post-op Pain Management:    Induction:   Airway Management Planned:   Additional Equipment:   Intra-op Plan:   Post-operative Plan:   Informed Consent: I have reviewed the patients History and Physical, chart, labs and discussed the procedure including the risks, benefits and alternatives for the proposed anesthesia with the patient or authorized representative who has indicated his/her understanding and acceptance.     Plan Discussed with: Anesthesiologist and Surgeon  Anesthesia Plan Comments:         Anesthesia Quick Evaluation

## 2011-05-09 NOTE — Progress Notes (Signed)
Dr Jolayne Panther consented pt for BTL.  OR transport took pt to or at this time by stretcher

## 2011-05-09 NOTE — Anesthesia Preprocedure Evaluation (Signed)
Anesthesia Evaluation  Patient identified by MRN, date of birth, ID band Patient awake    Reviewed: Allergy & Precautions, H&P , Patient's Chart, lab work & pertinent test results  Airway Mallampati: III TM Distance: >3 FB Neck ROM: full    Dental No notable dental hx.    Pulmonary neg pulmonary ROS,  clear to auscultation  Pulmonary exam normal       Cardiovascular neg cardio ROS regular Normal    Neuro/Psych Negative Neurological ROS  Negative Psych ROS   GI/Hepatic negative GI ROS, Neg liver ROS,   Endo/Other  Negative Endocrine ROSMorbid obesity  Renal/GU negative Renal ROS     Musculoskeletal   Abdominal   Peds  Hematology negative hematology ROS (+)   Anesthesia Other Findings   Reproductive/Obstetrics (+) Pregnancy                           Anesthesia Physical Anesthesia Plan  ASA: III  Anesthesia Plan: Epidural   Post-op Pain Management:    Induction:   Airway Management Planned:   Additional Equipment:   Intra-op Plan:   Post-operative Plan:   Informed Consent: I have reviewed the patients History and Physical, chart, labs and discussed the procedure including the risks, benefits and alternatives for the proposed anesthesia with the patient or authorized representative who has indicated his/her understanding and acceptance.     Plan Discussed with:   Anesthesia Plan Comments:         Anesthesia Quick Evaluation  

## 2011-05-09 NOTE — Progress Notes (Addendum)
Michelle Mcdowell is a 31 y.o. [id number] at [redacted]w[redacted]d  Subjective: Comfortable with epidural.  Objective: BP 113/48  Pulse 100  Temp(Src) 98.2 F (36.8 C) (Oral)  Resp 18  Ht 5\' 2"  (1.575 m)  Wt 112.492 kg (248 lb)  BMI 45.36 kg/m2  LMP 10/29/2010      FHT:  FHR: 146 bpm, variability: moderate,  accelerations:  Present,  decelerations:  Absent UC:   regular, every 2-3 minutes SVE:   Dilation: 7.5 Effacement (%): 80 Station: -1 Exam by:: Dr Aviva Signs and L. Lucianne Muss, RN  Labs: Lab Results  Component Value Date   WBC 13.5* 05/09/2011   HGB 11.6* 05/09/2011   HCT 35.5* 05/09/2011   MCV 87.2 05/09/2011   PLT 270 05/09/2011    Assessment / Plan: Augmentation of labor, progressing well  Labor: Progressing on Pitocin, will continue to increase then AROM Fetal Wellbeing:  Category I Pain Control:  Epidural I/D:   On penicillin Anticipated MOD:  NSVD   D. Piloto [employer]. MD PGY-1 05/09/2011, 10:37 AM

## 2011-05-09 NOTE — Progress Notes (Signed)
Patient ID: Michelle Mcdowell, female   DOB: July 21, 1980, 31 y.o.   MRN: [id number]  31 y.o. yo [phone]  with undesired fertility,status post vaginal delivery, desires permanent sterilization. Risks and benefits of procedure discussed with patient including permanence of method, bleeding, infection, injury to surrounding organs and need for additional procedures. Risk failure of 0.5-1% with increased risk of ectopic gestation if pregnancy occurs was also discussed with patient.

## 2011-05-09 NOTE — Progress Notes (Signed)
Michelle Mcdowell is a 31 y.o. [id number] at [redacted]w[redacted]d admitted for induction of labor due to oligo & BPP 4/10.  Subjective: Uncomfortable w/ uc's, pain mainly in low abdominal/pelvic area, requesting pain medication to help ease the pain.    Objective: BP 136/91   Pulse 87   Temp(Src) 98.2 F (36.8 C) (Oral)   Resp 18   Ht 5\' 2"  (1.575 m)   Wt 112.492 kg (248 lb)   BMI 45.36 kg/m2   LMP 10/29/2010      FHT:  135, moderate variability, 15x15 accels, no decels= Cat I UC:   regular, every 3 minutes SVE:   Dilation: 1.5 Effacement (%): 40 Station: -3 Exam by:: kbooker, snm  Labs: Lab Results  Component Value Date   WBC 7.3 05/08/2011   HGB 11.3* 05/08/2011   HCT 34.0* 05/08/2011   MCV 86.5 05/08/2011   PLT 287 05/08/2011    Assessment / Plan: IOL- no cervical change on pitocin as of yet  Labor: no labor yet, pitocin currently on 52mu/min, will continue to increase  Preeclampsia:  no s/s Fetal Wellbeing:  Category I Pain Control:  requesting pain med I/D:  n/a Anticipated MOD:  NSVD  Joellyn Haff, SNM 05/09/2011, 1:08 AM

## 2011-05-09 NOTE — Progress Notes (Signed)
Cervix is 9.5-10, complete effacement except  small upper lip. Station -1 Amniotomy performed. Small amount of clear fluid present.    D. Piloto Sherron Flemings Paz. MD PGY-1 05/09/2011, 11:22 AM

## 2011-05-09 NOTE — Op Note (Signed)
PREOPERATIVE DIAGNOSIS:  Undesired fertility  POSTOPERATIVE DIAGNOSIS:  Undesired fertility  PROCEDURE:  Postpartum Bilateral Tubal Sterilization using Pomeroy method   ANESTHESIA:  Epidural  COMPLICATIONS:  None immediate.  ESTIMATED BLOOD LOSS:  Less than 20cc.  FLUIDS: 1000 cc LR.  URINE OUTPUT:  100 cc of clear urine.  INDICATIONS: 31 y.o. yo [phone]  with undesired fertility,status post vaginal delivery, desires permanent sterilization. Risks and benefits of procedure discussed with patient including permanence of method, bleeding, infection, injury to surrounding organs and need for additional procedures. Risk failure of 0.5-1% with increased risk of ectopic gestation if pregnancy occurs was also discussed with patient.   FINDINGS:  Normal uterus, tubes, and ovaries.  TECHNIQUE: After informed consent was obtained, the patient was taken to the operating room where anesthesia was induced and found to be adequate. A small transverse, infraumbilical skin incision was made with the scalpel. This incision was carried down to the underlying layer of fascia. The fascia was grasped with Kocher clamps tented up and entered sharply with Mayo scissors. Underlying peritoneum was then identified tented up and entered sharply with Metzenbaum scissors. The fascia was tagged with 0 Vicryl. The patient's left fallopian tube was then identified, brought to the incision, and grasped with a Babcock clamp. The tube was then followed out to the fimbria. The Babcock clamp was then used to grasp the tube approximately 4 cm from the cornual region. A 3 cm segment of the tube was then ligated with free tie of plain gut suture, transected and excised. Good hemostasis was noted and the tube was returned to the abdomen. The right fallopian tube was then identified to its fimbriated end, ligated, and a 3 cm segment excised in a similar fashion. Excellent hemostasis was noted, and the tube returned to the abdomen. The  fascia was re-approximated with 0 Vicryl. The skin was closed in a subcuticular fashion with 3-0 Vicryl. Quarter percent Marcaine solution was then injected at the incision site. The patient tolerated the procedure well. Sponge, lap, and needle count were correct x2. The patient was taken to recovery room in stable condition.

## 2011-05-10 ENCOUNTER — Encounter (HOSPITAL_COMMUNITY): Payer: Self-pay | Admitting: Obstetrics and Gynecology

## 2011-05-10 NOTE — Anesthesia Postprocedure Evaluation (Signed)
  Anesthesia Post-op Note  Patient: Michelle Mcdowell  Procedure(s) Performed: * Lumbar Epidural for L&D *  Patient Location: PACU and Nursing Unit  Anesthesia Type: Epidural  Level of Consciousness: awake, alert  and oriented  Airway and Oxygen Therapy: Patient Spontanous Breathing  Post-op Pain: none  Post-op Assessment: Post-op Vital signs reviewed, Patient's Cardiovascular Status Stable, Respiratory Function Stable, Patent Airway, No signs of Nausea or vomiting, Adequate PO intake, Pain level controlled, No headache, No residual numbness and No residual motor weakness  Post-op Vital Signs: Reviewed and stable  Complications: No apparent anesthesia complications

## 2011-05-10 NOTE — Progress Notes (Signed)
Post Partum Day 1 Subjective: no complaints, up ad lib, voiding, tolerating PO and + flatus  Objective: Blood pressure 114/78, pulse 87, temperature 98 F (36.7 C), temperature source Oral, resp. rate 18, height 5\' 2"  (1.575 m), weight 112.492 kg (248 lb), last menstrual period 10/29/2010, SpO2 95.00%  Physical Exam:  General: alert, cooperative and no distress Lochia: appropriate Uterine Fundus: firm Incision: from tubal ligation with no erythema, healing well DVT Evaluation: No evidence of DVT seen on physical exam. Negative Homan's sign.   Basename 05/09/11 0520 05/08/11 1745  HGB 11.6* 11.3*  HCT 35.5* 34.0*    Assessment/Plan: Plan for discharge tomorrow and Contraception BTL Bottle feeding.  LOS: 2 days   D. Piloto [employer]. MD PGY-1 05/10/2011, 7:35 AM

## 2011-05-10 NOTE — Progress Notes (Signed)
UR chart review completed.  

## 2011-05-11 MED ORDER — IBUPROFEN 600 MG PO TABS: 600.0000 mg | ORAL_TABLET | Freq: Four times a day (QID) | ORAL | Status: AC | PRN

## 2011-05-11 MED ORDER — OXYCODONE-ACETAMINOPHEN 5-325 MG PO TABS: 1.0000 | ORAL_TABLET | ORAL | Status: AC | PRN

## 2011-05-11 MED ORDER — SENNOSIDES-DOCUSATE SODIUM 8.6-50 MG PO TABS: 2.0000 | ORAL_TABLET | Freq: Every day | ORAL | Status: DC

## 2011-05-11 NOTE — Discharge Summary (Signed)
Obstetric Discharge Summary Reason for Admission: induction of labor due to oligohydramnios and biophysical profile of 4/10 Prenatal Procedures: NST and ultrasound Intrapartum Procedures: spontaneous vaginal delivery Postpartum Procedures: P.P. tubal ligation Complications-Operative and Postpartum: none Hemoglobin  Date Value Range Status  05/09/2011 11.6* 12.0-15.0 (g/dL) Final     HCT  Date Value Range Status  05/09/2011 35.5* 36.0-46.0 (%) Final    Discharge Diagnoses: Term Pregnancy-delivered  Discharge Information: Date: 05/11/2011 Activity: unrestricted Diet: routine Medications: Ibuprofen,  Percocet and Senakot  Condition: stable Instructions: refer to practice specific booklet Discharge to: home Follow-up Information    Follow up with [facility]. (make an appointment in 6 weeks)          Newborn Data: Live born female  Birth Weight: 6 lb 1 oz (2750 g) APGAR: 7, 9  Home with mother.   D. Piloto Sherron Flemings Paz. MD PGY-1 05/11/2011, 7:52 AM Follow up at [facility]

## 2011-05-11 NOTE — Progress Notes (Signed)
Sw referral received to assess "poor support and unplanned pregnancy."  Pt told Sw that she lives alone but has "plenty" of support.  She has all the necessary supplies for the infant and was preparing to discharge when Sw arrived.  As per bedside RN yesterday, pt was appropriate and did not appear to be in need of Sw intervention. 

## 2011-05-11 NOTE — Progress Notes (Signed)
Post Partum Day 2  Subjective: no complaints, up ad lib, voiding, tolerating PO and + flatus  Objective: Blood pressure 107/62, pulse 86, temperature 97.8 F (36.6 C), temperature source Oral, resp. rate 18, height 5\' 2"  (1.575 m), weight 112.492 kg (248 lb), last menstrual period 10/29/2010, SpO2 97.00%, unknown if currently breastfeeding.  Physical Exam:  General: alert, cooperative and no distress Lochia: appropriate Uterine Fundus: firm Incision: from tubal ligation with no erythema, healing well DVT Evaluation: No evidence of DVT seen on physical exam. Negative Homan's sign.   Basename 05/09/11 0520 05/08/11 1745  HGB 11.6* 11.3*  HCT 35.5* 34.0*    Assessment/Plan: Plan for discharge tomorrow and Contraception BTL Bottle feeding.  . LOS: 3 days   D. Piloto [employer]. MD PGY-1 05/11/2011, 7:46 AM

## 2011-09-04 ENCOUNTER — Encounter (HOSPITAL_COMMUNITY): Payer: Self-pay | Admitting: *Deleted

## 2011-09-04 ENCOUNTER — Emergency Department (HOSPITAL_COMMUNITY)
Admission: EM | Admit: 2011-09-04 | Discharge: 2011-09-04 | Discharge status: Against Medical Advice | Payer: Self-pay | Attending: Emergency Medicine | Admitting: Emergency Medicine

## 2011-09-04 DIAGNOSIS — R55 Syncope and collapse: Secondary | ICD-10-CM | POA: Insufficient documentation

## 2011-09-04 DIAGNOSIS — R51 Headache: Secondary | ICD-10-CM | POA: Insufficient documentation

## 2011-09-04 NOTE — ED Notes (Signed)
No answer when called x 3.  

## 2011-09-04 NOTE — ED Notes (Signed)
Pt had bent over to tie shoe for a pt, when stood up, felt dizzy and had syncopal episode. Abrasion to rt knee.  Alert, now.  Headache.

## 2011-09-04 NOTE — ED Notes (Signed)
Called pt in all waiting areas with no response, unable to locate

## 2012-01-29 ENCOUNTER — Emergency Department (HOSPITAL_COMMUNITY): Payer: Self-pay

## 2012-01-29 ENCOUNTER — Emergency Department (HOSPITAL_COMMUNITY)
Admission: EM | Admit: 2012-01-29 | Discharge: 2012-01-29 | Disposition: A | Discharge status: Home / Self Care | Payer: Self-pay | Attending: Emergency Medicine | Admitting: Emergency Medicine

## 2012-01-29 ENCOUNTER — Encounter (HOSPITAL_COMMUNITY): Payer: Self-pay | Admitting: *Deleted

## 2012-01-29 DIAGNOSIS — Z8679 Personal history of other diseases of the circulatory system: Secondary | ICD-10-CM | POA: Insufficient documentation

## 2012-01-29 DIAGNOSIS — M519 Unspecified thoracic, thoracolumbar and lumbosacral intervertebral disc disorder: Secondary | ICD-10-CM | POA: Insufficient documentation

## 2012-01-29 DIAGNOSIS — M5146 Schmorl's nodes, lumbar region: Secondary | ICD-10-CM | POA: Insufficient documentation

## 2012-01-29 MED ORDER — HYDROCODONE-ACETAMINOPHEN 7.5-325 MG PO TABS: 1.0000 | ORAL_TABLET | Freq: Four times a day (QID) | ORAL | Status: DC | PRN

## 2012-01-29 MED ORDER — HYDROCODONE-ACETAMINOPHEN 5-325 MG PO TABS
2.0000 | ORAL_TABLET | Freq: Once | ORAL | Status: AC
  Administered 2012-01-29: 2 via ORAL
  Filled 2012-01-29: qty 2

## 2012-01-29 MED ORDER — BACLOFEN 10 MG PO TABS: 10.0000 mg | ORAL_TABLET | Freq: Three times a day (TID) | ORAL | Status: AC

## 2012-01-29 MED ORDER — DEXAMETHASONE 6 MG PO TABS: ORAL_TABLET | ORAL | Status: DC

## 2012-01-29 MED ORDER — DIAZEPAM 5 MG PO TABS
5.0000 mg | ORAL_TABLET | Freq: Once | ORAL | Status: AC
  Administered 2012-01-29: 5 mg via ORAL
  Filled 2012-01-29: qty 1

## 2012-01-29 MED ORDER — DEXAMETHASONE SODIUM PHOSPHATE 4 MG/ML IJ SOLN
8.0000 mg | Freq: Once | INTRAMUSCULAR | Status: AC
  Administered 2012-01-29: 8 mg via INTRAMUSCULAR
  Filled 2012-01-29: qty 2

## 2012-01-29 NOTE — ED Notes (Signed)
Patient transported to CT 

## 2012-01-29 NOTE — ED Notes (Signed)
H. Bryant, PA at bedside. 

## 2012-01-29 NOTE — ED Notes (Signed)
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

## 2012-01-29 NOTE — ED Notes (Signed)
Low back pain , radiates down rt leg at first, now both legs hurt.  No recent injury.  Has been taking robaxin and tylenol without relief.

## 2012-01-29 NOTE — ED Provider Notes (Signed)
Medical screening examination/treatment/procedure(s) were performed by non-physician practitioner and as supervising physician I was immediately available for consultation/collaboration. Devoria Albe, MD, FACEP   Ward Givens, MD 01/29/12 1440

## 2012-01-29 NOTE — ED Provider Notes (Signed)
History     CSN: [id number]  Arrival date & time 01/29/12  1056   None     Chief Complaint  Patient presents with  . Back Pain    (Consider location/radiation/quality/duration/timing/severity/associated sxs/prior treatment) HPI Comments: Patient states she's had some mild low back problems from time to time. The patient states that over the last 2-3 days she's been having increasing pain of the lower back and now going down her right leg. Yesterday she had some problem with walking. Today she has problems standing or getting even getting out of the bed. She has not had any loss of control of bowel or bladder. She's not had a fall but felt that her leg might give away. There's been no injury to the back.  The history is provided by the patient.    Past Medical History  Diagnosis Date  . Migraine     Past Surgical History  Procedure Date  . Back surgery   . Knee surgery   . Tubal ligation 05/09/2011    Procedure: POST PARTUM TUBAL LIGATION;  Surgeon: Catalina Antigua, MD;  Location: [facility];  Service: Gynecology;  Laterality: N/A;    History reviewed. No pertinent family history.  History  Substance Use Topics  . Smoking status: Never Smoker   . Smokeless tobacco: Never Used  . Alcohol Use: No    OB History    Grav Para Term Preterm Abortions TAB SAB Ect Mult Living   6 3 3  3  3   3       Review of Systems  Constitutional: Negative for activity change.       All ROS Neg except as noted in HPI  HENT: Negative for nosebleeds and neck pain.   Eyes: Negative for photophobia and discharge.  Respiratory: Negative for cough, shortness of breath and wheezing.   Cardiovascular: Negative for chest pain and palpitations.  Gastrointestinal: Negative for abdominal pain and blood in stool.  Genitourinary: Negative for dysuria, frequency and hematuria.  Musculoskeletal: Positive for back pain and arthralgias.  Skin: Negative.   Neurological: Positive for headaches. Negative for  dizziness, seizures and speech difficulty.  Psychiatric/Behavioral: Negative for hallucinations and confusion.    Allergies  Review of patient's allergies indicates no known allergies.  Home Medications   Current Outpatient Rx  Name Route Sig Dispense Refill  . ACETAMINOPHEN 500 MG PO TABS Oral Take 2,000 mg by mouth every 6 (six) hours as needed. Pain    . ALBUTEROL SULFATE HFA 108 (90 BASE) MCG/ACT IN AERS Inhalation Inhale 2 puffs into the lungs every 6 (six) hours as needed. Shortness of Breath    . METHOCARBAMOL 500 MG PO TABS Oral Take 500 mg by mouth 4 (four) times daily. Muscle Spasms      BP 127/71  Pulse 87  Temp 97.7 F (36.5 C) (Oral)  Resp 18  Ht 5\' 2"  (1.575 m)  Wt 228 lb (103.42 kg)  BMI 41.70 kg/m2  SpO2 100%  LMP 01/28/2012  Breastfeeding? No  Physical Exam  Nursing note and vitals reviewed. Constitutional: She is oriented to person, place, and time. She appears well-developed and well-nourished.  Non-toxic appearance.  HENT:  Head: Normocephalic.  Right Ear: Tympanic membrane and external ear normal.  Left Ear: Tympanic membrane and external ear normal.  Eyes: EOM and lids are normal. Pupils are equal, round, and reactive to light.  Neck: Normal range of motion. Neck supple. Carotid bruit is not present.  Cardiovascular: Normal rate, regular rhythm,  normal heart sounds, intact distal pulses and normal pulses.   Pulmonary/Chest: Breath sounds normal. No respiratory distress.  Abdominal: Soft. Bowel sounds are normal. There is no tenderness. There is no guarding.  Musculoskeletal: Normal range of motion.       Lumbar area pain to palpation in the lower lumbar region. Paraspinal lumbar area pain to the mid to lower portion of the back. Straight leg raise on the right causes severe pain.  Lymphadenopathy:       Head (right side): No submandibular adenopathy present.       Head (left side): No submandibular adenopathy present.    She has no cervical  adenopathy.  Neurological: She is alert and oriented to person, place, and time. She has normal strength. No cranial nerve deficit or sensory deficit.       No gross motor or sensory changes noted on examination.  Skin: Skin is warm and dry.  Psychiatric: She has a normal mood and affect. Her speech is normal.    ED Course  Procedures (including critical care time)  Labs Reviewed - No data to display No results found.   No diagnosis found.    MDM  I have reviewed nursing notes, vital signs, and all appropriate lab and imaging results for this patient. Patient states the back pain has changed congestive pain in the lower back to now radiating down the right leg and causing extreme difficulty with her walking, getting out of bed, and standing. Will obtain a CT lumbar spine.  CT scan of the lumbar spine reveals minimally bulging disc at the L4-L5 and L5-S1 level. There is a Schmori's node at the inferior endplate of L5. The plan at this time is for the patient to be on Decadron 6 mg daily, baclofen 1 Tablet 3 times daily for spasm, and Norco 7.5 mg every 4 hours #20 tablets. Patient referred to Dr. Devonne Doughty (orthopedics) for evaluation and management of this problem.     Kathie Dike, [place] 01/29/12 1435

## 2012-03-23 ENCOUNTER — Emergency Department (HOSPITAL_COMMUNITY)
Admission: EM | Admit: 2012-03-23 | Discharge: 2012-03-24 | Disposition: A | Discharge status: Home / Self Care | Payer: Self-pay | Attending: Emergency Medicine | Admitting: Emergency Medicine

## 2012-03-23 ENCOUNTER — Encounter (HOSPITAL_COMMUNITY): Payer: Self-pay | Admitting: *Deleted

## 2012-03-23 DIAGNOSIS — Z79899 Other long term (current) drug therapy: Secondary | ICD-10-CM | POA: Insufficient documentation

## 2012-03-23 DIAGNOSIS — Z8679 Personal history of other diseases of the circulatory system: Secondary | ICD-10-CM | POA: Insufficient documentation

## 2012-03-23 DIAGNOSIS — R55 Syncope and collapse: Secondary | ICD-10-CM | POA: Insufficient documentation

## 2012-03-23 NOTE — ED Notes (Signed)
Pt arrives pov d/t loc at work. Pt states she does not recall passing out but woke on the floor with coworkers over her.

## 2012-03-24 ENCOUNTER — Emergency Department (HOSPITAL_COMMUNITY): Payer: Self-pay

## 2012-03-24 LAB — BASIC METABOLIC PANEL
CO2: 29 mEq/L (ref 19–32)
Chloride: 105 mEq/L (ref 96–112)
GFR calc Af Amer: >90 mL/min (ref >90)
Potassium: 3.9 mEq/L (ref 3.5–5.1)
Sodium: 139 mEq/L (ref 135–145)

## 2012-03-24 LAB — URINALYSIS, ROUTINE W REFLEX MICROSCOPIC
Ketones, ur: NEGATIVE mg/dL
Leukocytes, UA: NEGATIVE
Nitrite: NEGATIVE
Specific Gravity, Urine: 1.015 (ref 1.005–1.030)
pH: 8 (ref 5.0–8.0)

## 2012-03-24 LAB — PREGNANCY, URINE: Preg Test, Ur: NEGATIVE

## 2012-03-24 LAB — CBC
Platelets: 318 10*3/uL (ref 150–400)
RBC: 4.07 MIL/uL (ref 3.87–5.11)
RDW: 12.5 % (ref 11.5–15.5)
WBC: 6.3 10*3/uL (ref 4.0–10.5)

## 2012-03-24 MED ORDER — IBUPROFEN 800 MG PO TABS
800.0000 mg | ORAL_TABLET | Freq: Once | ORAL | Status: AC
  Administered 2012-03-24: 800 mg via ORAL
  Filled 2012-03-24: qty 1

## 2012-03-24 NOTE — ED Notes (Signed)
Pt discharged. Pt stable at time of discharge. pt has no questions regarding discharge at this time. Pt voiced understanding of discharge instructions.  

## 2012-03-24 NOTE — ED Provider Notes (Signed)
History     CSN: [id number]  Arrival date & time 03/23/12  2347   First MD Initiated Contact with Patient 03/24/12 0022      Chief Complaint  Patient presents with  . Loss of Consciousness    (Consider location/radiation/quality/duration/timing/severity/associated sxs/prior treatment) HPI  Michelle Mcdowell is a 31 y.o. female who presents to the Emergency Department complaining of syncopal episode while at work tonight. States she felt "strange" and woke up on the floor with co-workers around her. She was on her way to the bathroom however she denies abdominal pain or nausea that proceeded the syncopal event. She denies fever, chills, difficulty talking or swallowing, weakness of an extremity. She is c/o pain to the back of her head and pain to the right side of her mid back.   Past Medical History  Diagnosis Date  . Migraine     Past Surgical History  Procedure Date  . Back surgery   . Knee surgery   . Tubal ligation 05/09/2011    Procedure: POST PARTUM TUBAL LIGATION;  Surgeon: Catalina Antigua, MD;  Location: [facility];  Service: Gynecology;  Laterality: N/A;    History reviewed. No pertinent family history.  History  Substance Use Topics  . Smoking status: Never Smoker   . Smokeless tobacco: Never Used  . Alcohol Use: No    OB History    Grav Para Term Preterm Abortions TAB SAB Ect Mult Living   6 3 3  3  3   3       Review of Systems  Constitutional: Negative for fever.       10 Systems reviewed and are negative for acute change except as noted in the HPI.  HENT: Negative for congestion.   Eyes: Negative for discharge and redness.  Respiratory: Negative for cough and shortness of breath.   Cardiovascular: Negative for chest pain.  Gastrointestinal: Negative for vomiting and abdominal pain.  Musculoskeletal: Positive for back pain.  Skin: Negative for rash.  Neurological: Positive for syncope. Negative for numbness and headaches.  Psychiatric/Behavioral:        No behavior change.    Allergies  Review of patient's allergies indicates no known allergies.  Home Medications   Current Outpatient Rx  Name  Route  Sig  Dispense  Refill  . ACETAMINOPHEN 500 MG PO TABS   Oral   Take 2,000 mg by mouth every 6 (six) hours as needed. Pain         . ALBUTEROL SULFATE HFA 108 (90 BASE) MCG/ACT IN AERS   Inhalation   Inhale 2 puffs into the lungs every 6 (six) hours as needed. Shortness of Breath         . DEXAMETHASONE 6 MG PO TABS      1 po daily with food   6 tablet   0   . HYDROCODONE-ACETAMINOPHEN 7.5-325 MG PO TABS   Oral   Take 1 tablet by mouth every 6 (six) hours as needed for pain.   20 tablet   0   . METHOCARBAMOL 500 MG PO TABS   Oral   Take 500 mg by mouth 4 (four) times daily. Muscle Spasms           BP 105/63  Pulse 67  Temp 98.3 F (36.8 C) (Oral)  Resp 20  Ht 5\' 3"  (1.6 m)  Wt 218 lb (98.884 kg)  BMI 38.62 kg/m2  SpO2 98%  LMP 03/09/2012  Breastfeeding? No  Physical Exam  Nursing  note and vitals reviewed. Constitutional: She appears well-developed and well-nourished.       Awake, alert, nontoxic appearance.  HENT:  Head: Normocephalic and atraumatic.  Right Ear: External ear normal.  Left Ear: External ear normal.       tenderness to palpation over back of head, no obvious hematoma or bruising/abrasion.  Eyes: Conjunctivae normal and EOM are normal. Pupils are equal, round, and reactive to light. Right eye exhibits no discharge. Left eye exhibits no discharge.  Neck: Normal range of motion. Neck supple.  Cardiovascular: Normal heart sounds.   Pulmonary/Chest: Effort normal and breath sounds normal. She exhibits no tenderness.  Abdominal: Soft. There is no tenderness. There is no rebound.  Musculoskeletal: Normal range of motion. She exhibits no tenderness.       Baseline ROM, no obvious new focal weakness.No spinal tenderness to percussion. Mild tenderness to right mid back with palpation.    Neurological:       Mental status and motor strength appears baseline for patient and situation.  Skin: No rash noted.  Psychiatric: She has a normal mood and affect.    ED Course  Procedures (including critical care time)  Labs Reviewed  BASIC METABOLIC PANEL - Abnormal; Notable for the following:    Glucose, Bld 107 (*)     All other components within normal limits  URINALYSIS, ROUTINE W REFLEX MICROSCOPIC - Abnormal; Notable for the following:    APPearance HAZY (*)     All other components within normal limits  CBC  PREGNANCY, URINE   Dg Thoracic Spine 2 View  03/24/2012  *RADIOLOGY REPORT*  Clinical Data: Mid upper back pain, right flank pain, dizziness. Loss of consciousness.  THORACIC SPINE - 2 VIEW  Comparison: Chest 05/24/2009  Findings: Normal alignment of the thoracic vertebrae.  Degenerative changes with disc space narrowing and mild endplate hypertrophic changes in the mid thoracic region.  No vertebral compression deformities.  Intervertebral disc space heights are preserved.  No paraspinal soft tissue swelling.  IMPRESSION: No displaced fractures identified.   Original Report Authenticated By: Burman Nieves, M.D.    Ct Head Wo Contrast  03/24/2012  *RADIOLOGY REPORT*  Clinical Data: Dizziness, weakness, headache, loss of consciousness.  CT HEAD WITHOUT CONTRAST  Technique:  Contiguous axial images were obtained from the base of the skull through the vertex without contrast.  Comparison: 03/20/2007  Findings: The ventricles and sulci are symmetrical without significant effacement, displacement, or dilatation. No mass effect or midline shift. No abnormal extra-axial fluid collections. The grey-white matter junction is distinct. Basal cisterns are not effaced. No acute intracranial hemorrhage. No depressed skull fractures.  Visualized paranasal sinuses and mastoid air cells are not opacified.  No significant change since previous study.  IMPRESSION: No acute intracranial  abnormality.   Original Report Authenticated By: Burman Nieves, M.D.      1. Syncope       MDM  Patient presents with syncopal episode at work. CT head normal. T spine film normal. Given ibuprofen for discomfort. Dx testing d/w pt.  Questions answered.  Verb understanding, agreeable to d/c home with outpt f/u.Pt stable in ED with no significant deterioration in condition.The patient appears reasonably screened and/or stabilized for discharge and I doubt any other medical condition or other [facility] requiring further screening, evaluation, or treatment in the ED at this time prior to discharge.  MDM Reviewed: nursing note and vitals Interpretation: CT scan, x-ray and labs           [employer]  Velia Meyer, MD 03/24/12 [phone]

## 2012-04-25 ENCOUNTER — Emergency Department (HOSPITAL_COMMUNITY)
Admission: EM | Admit: 2012-04-25 | Discharge: 2012-04-25 | Disposition: A | Discharge status: Home / Self Care | Payer: Self-pay | Attending: Emergency Medicine | Admitting: Emergency Medicine

## 2012-04-25 ENCOUNTER — Emergency Department (HOSPITAL_COMMUNITY): Payer: Self-pay

## 2012-04-25 ENCOUNTER — Encounter (HOSPITAL_COMMUNITY): Payer: Self-pay

## 2012-04-25 DIAGNOSIS — R51 Headache: Secondary | ICD-10-CM | POA: Insufficient documentation

## 2012-04-25 DIAGNOSIS — J029 Acute pharyngitis, unspecified: Secondary | ICD-10-CM | POA: Insufficient documentation

## 2012-04-25 DIAGNOSIS — R059 Cough, unspecified: Secondary | ICD-10-CM | POA: Insufficient documentation

## 2012-04-25 DIAGNOSIS — R0789 Other chest pain: Secondary | ICD-10-CM | POA: Insufficient documentation

## 2012-04-25 DIAGNOSIS — IMO0001 Reserved for inherently not codable concepts without codable children: Secondary | ICD-10-CM | POA: Insufficient documentation

## 2012-04-25 DIAGNOSIS — J3489 Other specified disorders of nose and nasal sinuses: Secondary | ICD-10-CM | POA: Insufficient documentation

## 2012-04-25 DIAGNOSIS — Z8679 Personal history of other diseases of the circulatory system: Secondary | ICD-10-CM | POA: Insufficient documentation

## 2012-04-25 DIAGNOSIS — R05 Cough: Secondary | ICD-10-CM | POA: Insufficient documentation

## 2012-04-25 DIAGNOSIS — F17200 Nicotine dependence, unspecified, uncomplicated: Secondary | ICD-10-CM | POA: Insufficient documentation

## 2012-04-25 DIAGNOSIS — J111 Influenza due to unidentified influenza virus with other respiratory manifestations: Secondary | ICD-10-CM

## 2012-04-25 DIAGNOSIS — R5383 Other fatigue: Secondary | ICD-10-CM | POA: Insufficient documentation

## 2012-04-25 DIAGNOSIS — R5381 Other malaise: Secondary | ICD-10-CM | POA: Insufficient documentation

## 2012-04-25 DIAGNOSIS — R112 Nausea with vomiting, unspecified: Secondary | ICD-10-CM | POA: Insufficient documentation

## 2012-04-25 LAB — CBC WITH DIFFERENTIAL/PLATELET
Basophils Absolute: 0 10*3/uL (ref 0.0–0.1)
Basophils Relative: 0 % (ref 0–1)
Eosinophils Absolute: 0.1 10*3/uL (ref 0.0–0.7)
Eosinophils Relative: 1 % (ref 0–5)
HCT: 37.1 % (ref 36.0–46.0)
Hemoglobin: 12.4 g/dL (ref 12.0–15.0)
Lymphocytes Relative: 5 % — ABNORMAL LOW (ref 12–46)
Lymphs Abs: 0.4 10*3/uL — ABNORMAL LOW (ref 0.7–4.0)
MCH: 29.1 pg (ref 26.0–34.0)
MCHC: 33.4 g/dL (ref 30.0–36.0)
MCV: 87.1 fL (ref 78.0–100.0)
Monocytes Absolute: 0.2 10*3/uL (ref 0.1–1.0)
Monocytes Relative: 3 % (ref 3–12)
Neutro Abs: 7.6 10*3/uL (ref 1.7–7.7)
Neutrophils Relative %: 92 % — ABNORMAL HIGH (ref 43–77)
Platelets: 257 10*3/uL (ref 150–400)
RBC: 4.26 MIL/uL (ref 3.87–5.11)
RDW: 12.4 % (ref 11.5–15.5)
WBC: 8.3 10*3/uL (ref 4.0–10.5)

## 2012-04-25 LAB — BASIC METABOLIC PANEL
BUN: 9 mg/dL (ref 6–23)
CO2: 25 mEq/L (ref 19–32)
Calcium: 9.2 mg/dL (ref 8.4–10.5)
Chloride: 100 mEq/L (ref 96–112)
Creatinine, Ser: 0.75 mg/dL (ref 0.50–1.10)
GFR calc Af Amer: >90 mL/min (ref >90)
GFR calc non Af Amer: >90 mL/min (ref >90)
Glucose, Bld: 99 mg/dL (ref 70–99)
Potassium: 3.8 mEq/L (ref 3.5–5.1)
Sodium: 135 mEq/L (ref 135–145)

## 2012-04-25 MED ORDER — KETOROLAC TROMETHAMINE 30 MG/ML IJ SOLN
30.0000 mg | Freq: Once | INTRAMUSCULAR | Status: AC
  Administered 2012-04-25: 30 mg via INTRAVENOUS
  Filled 2012-04-25: qty 1

## 2012-04-25 MED ORDER — GUAIFENESIN ER 1200 MG PO TB12: 1.0000 | ORAL_TABLET | Freq: Two times a day (BID) | ORAL | Status: DC

## 2012-04-25 MED ORDER — ONDANSETRON HCL 4 MG/2ML IJ SOLN
4.0000 mg | Freq: Once | INTRAMUSCULAR | Status: AC
  Administered 2012-04-25: 4 mg via INTRAVENOUS
  Filled 2012-04-25: qty 2

## 2012-04-25 MED ORDER — SODIUM CHLORIDE 0.9 % IV BOLUS (SEPSIS)
2000.0000 mL | Freq: Once | INTRAVENOUS | Status: AC
  Administered 2012-04-25: 2000 mL via INTRAVENOUS

## 2012-04-25 MED ORDER — PROMETHAZINE-DM 6.25-15 MG/5ML PO SYRP: 5.0000 mL | ORAL_SOLUTION | Freq: Four times a day (QID) | ORAL | Status: DC | PRN

## 2012-04-25 MED ORDER — ACETAMINOPHEN-CODEINE #3 300-30 MG PO TABS: 1.0000 | ORAL_TABLET | ORAL | Status: DC | PRN

## 2012-04-25 MED ORDER — IBUPROFEN 800 MG PO TABS: 800.0000 mg | ORAL_TABLET | Freq: Three times a day (TID) | ORAL | Status: DC | PRN

## 2012-04-25 NOTE — ED Notes (Signed)
Started to feel bad a few days ago her daughter was in hospital last night w/ flu s/s pt having aching and vomiting coughing so hard chest is hurting

## 2012-04-25 NOTE — ED Provider Notes (Signed)
History     CSN: [id number]  Arrival date & time 04/25/12  1157   First MD Initiated Contact with Patient 04/25/12 1255      No chief complaint on file.   (Consider location/radiation/quality/duration/timing/severity/associated sxs/prior treatment) HPI Patient presents to the emergency department with complaints of fever, nausea, vomiting, headache, chills, chest tightness, cough, and congestion which started yesterday. Myalgias started this morning. She measured her temperature at 101 at home and has since taken Tylenol and Mucinex.  Her 87-month-old daughter was diagnosed with influenza last night and is currently admitted.   Past Medical History  Diagnosis Date  . Migraine     Past Surgical History  Procedure Date  . Back surgery   . Knee surgery   . Tubal ligation 05/09/2011    Procedure: POST PARTUM TUBAL LIGATION;  Surgeon: Catalina Antigua, MD;  Location: [facility];  Service: Gynecology;  Laterality: N/A;    No family history on file.  History  Substance Use Topics  . Smoking status: Current Every Day Smoker  . Smokeless tobacco: Never Used  . Alcohol Use: No    OB History    Grav Para Term Preterm Abortions TAB SAB Ect Mult Living   6 3 3  3  3   3       Review of Systems  Constitutional: Positive for fever, chills and fatigue.  HENT: Positive for congestion and sore throat. Negative for neck pain and neck stiffness.   Respiratory: Positive for cough and chest tightness. Negative for shortness of breath and wheezing.   Gastrointestinal: Positive for nausea and vomiting.  Musculoskeletal: Positive for myalgias.  Neurological: Positive for headaches. Negative for syncope and light-headedness.  All other systems reviewed and are negative.    Allergies  Review of patient's allergies indicates no known allergies.  Home Medications   Current Outpatient Rx  Name  Route  Sig  Dispense  Refill  . GUAIFENESIN ER 600 MG PO TB12   Oral   Take 600 mg by mouth 2  (two) times daily as needed. For cough           BP 108/71  Pulse 123  Temp 99.2 F (37.3 C)  Resp 20  SpO2 99%  Physical Exam  Nursing note and vitals reviewed. Constitutional: She is oriented to person, place, and time. She appears well-developed and well-nourished. She appears distressed.  HENT:  Head: Normocephalic and atraumatic.  Mouth/Throat: Oropharynx is clear and moist. No oropharyngeal exudate.  Eyes: Conjunctivae normal are normal. Pupils are equal, round, and reactive to light.  Neck: Normal range of motion. Neck supple.  Cardiovascular: Regular rhythm, normal heart sounds and intact distal pulses.  Tachycardia present.   Pulmonary/Chest: Effort normal and breath sounds normal.  Abdominal: Soft. She exhibits no distension and no mass. There is generalized tenderness. There is no rebound and no guarding.  Lymphadenopathy:    She has cervical adenopathy.  Neurological: She is alert and oriented to person, place, and time.  Skin: Skin is warm and dry. She is not diaphoretic.    ED Course  Procedures (including critical care time)  Labs Reviewed - No data to display Dg Chest 2 View  04/25/2012  *RADIOLOGY REPORT*  Clinical Data: Cough.  Vomiting and flu-like symptoms the past 4 days.  CHEST - 2 VIEW  Comparison: 05/24/2009  Findings: Lateral view degraded by patient arm position.  Midline trachea.  Normal heart size and mediastinal contours. No pleural effusion or pneumothorax.  .  Clear  lungs.  IMPRESSION: No acute cardiopulmonary disease.   Original Report Authenticated By: Jeronimo Greaves, M.D.      Patient be treated for influenza-like illness.  The patient has tolerated oral fluids without difficulty.  Patient is advised to rest as much as possible.  Told to increase her fluid intake, as much as possible.   MDM          Carlyle Dolly, PA-C 04/25/12 [phone]

## 2012-04-28 NOTE — ED Provider Notes (Signed)
Medical screening examination/treatment/procedure(s) were performed by non-physician practitioner and as supervising physician I was immediately available for consultation/collaboration.  Doug Sou, MD 04/28/12 [phone]

## 2012-05-19 ENCOUNTER — Emergency Department (HOSPITAL_COMMUNITY)
Admission: EM | Admit: 2012-05-19 | Discharge: 2012-05-19 | Disposition: A | Discharge status: Home / Self Care | Payer: Self-pay | Attending: Emergency Medicine | Admitting: Emergency Medicine

## 2012-05-19 ENCOUNTER — Encounter (HOSPITAL_COMMUNITY): Payer: Self-pay | Admitting: Emergency Medicine

## 2012-05-19 DIAGNOSIS — R51 Headache: Secondary | ICD-10-CM | POA: Insufficient documentation

## 2012-05-19 DIAGNOSIS — Z87891 Personal history of nicotine dependence: Secondary | ICD-10-CM | POA: Insufficient documentation

## 2012-05-19 MED ORDER — KETOROLAC TROMETHAMINE 30 MG/ML IJ SOLN
30.0000 mg | Freq: Once | INTRAMUSCULAR | Status: AC
  Administered 2012-05-19: 30 mg via INTRAVENOUS
  Filled 2012-05-19: qty 1

## 2012-05-19 MED ORDER — DIPHENHYDRAMINE HCL 50 MG/ML IJ SOLN
25.0000 mg | Freq: Once | INTRAMUSCULAR | Status: AC
  Administered 2012-05-19: 25 mg via INTRAVENOUS
  Filled 2012-05-19: qty 1

## 2012-05-19 MED ORDER — SODIUM CHLORIDE 0.9 % IV BOLUS (SEPSIS)
1000.0000 mL | Freq: Once | INTRAVENOUS | Status: AC
  Administered 2012-05-19: 1000 mL via INTRAVENOUS

## 2012-05-19 MED ORDER — METOCLOPRAMIDE HCL 5 MG/ML IJ SOLN
10.0000 mg | Freq: Once | INTRAMUSCULAR | Status: AC
  Administered 2012-05-19: 10 mg via INTRAVENOUS
  Filled 2012-05-19: qty 2

## 2012-05-19 NOTE — ED Notes (Signed)
Pt c/o migraine headache with nausea, light sensitivity. Denies vomiting or dizziness.

## 2012-05-19 NOTE — ED Provider Notes (Signed)
History    32 year old female with headache. Gradual onset yesterday. Denies trauma. Patient has a history of what she calls migraine headaches. Has had similar headaches previously. Pain is diffuse and throbbing. Mild nausea but no vomiting. Photophobia. No other visual complaints. No numbness, tingling or loss of strength. Patient has tried Tylenol and Excedrin Migraine with only minimal relief. No fever or chills. No neck pain or neck stiffness.    CSN: [id number]  Arrival date & time 05/19/12  0340   First MD Initiated Contact with Patient 05/19/12 [phone]      Chief Complaint  Patient presents with  . Migraine    (Consider location/radiation/quality/duration/timing/severity/associated sxs/prior treatment) HPI  Past Medical History  Diagnosis Date  . Migraine     Past Surgical History  Procedure Laterality Date  . Back surgery    . Knee surgery    . Tubal ligation  05/09/2011    Procedure: POST PARTUM TUBAL LIGATION;  Surgeon: Catalina Antigua, MD;  Location: [facility];  Service: Gynecology;  Laterality: N/A;    No family history on file.  History  Substance Use Topics  . Smoking status: Former Games developer  . Smokeless tobacco: Never Used  . Alcohol Use: No    OB History   Grav Para Term Preterm Abortions TAB SAB Ect Mult Living   6 3 3  3  3   3       Review of Systems  All systems reviewed and negative, other than as noted in HPI.   Allergies  Review of patient's allergies indicates no known allergies.  Home Medications   Current Outpatient Rx  Name  Route  Sig  Dispense  Refill  . acetaminophen-codeine (TYLENOL #3) 300-30 MG per tablet   Oral   Take 1 tablet by mouth every 4 (four) hours as needed (cough and pain).   15 tablet   0   . guaiFENesin (MUCINEX) 600 MG 12 hr tablet   Oral   Take 600 mg by mouth 2 (two) times daily as needed. For cough         . Guaifenesin 1200 MG TB12   Oral   Take 1 tablet (1,200 mg total) by mouth 2 (two) times daily.  20 each   0   . ibuprofen (ADVIL,MOTRIN) 800 MG tablet   Oral   Take 1 tablet (800 mg total) by mouth every 8 (eight) hours as needed for pain.   21 tablet   0     BP 141/79  Pulse 83  Temp(Src) 97.1 F (36.2 C) (Oral)  Resp 14  SpO2 100%  LMP 05/05/2012  Physical Exam  Nursing note and vitals reviewed. Constitutional: She is oriented to person, place, and time. She appears well-developed and well-nourished. No distress.  Laying in bed. Uncomfortable appearing, but not toxic. Obese.  HENT:  Head: Normocephalic and atraumatic.  Eyes: Conjunctivae are normal. Pupils are equal, round, and reactive to light. Right eye exhibits no discharge. Left eye exhibits no discharge.  Neck: Neck supple.  No nuchal rigidity  Cardiovascular: Normal rate, regular rhythm and normal heart sounds.  Exam reveals no gallop and no friction rub.   No murmur heard. Pulmonary/Chest: Effort normal and breath sounds normal. No respiratory distress.  Abdominal: Soft. She exhibits no distension. There is no tenderness.  Musculoskeletal: She exhibits no edema and no tenderness.  Neurological: She is alert and oriented to person, place, and time. No cranial nerve deficit. She exhibits normal muscle tone. Coordination normal.  Speech  is clear content is appropriate. Good finger nose testing bilaterally.  Skin: Skin is warm and dry.  Psychiatric: She has a normal mood and affect. Her behavior is normal. Thought content normal.    ED Course  Procedures (including critical care time)  Labs Reviewed - No data to display No results found.   1. Headache       MDM  31yf with headache. Suspect primary HA. Consider emergent secondary causes such as bleed, infectious or mass but doubt. There is no history of trauma. Pt has a nonfocal neurological exam. Afebrile and neck supple. No use of blood thinning medication. Consider ocular etiology such as acute angle closure glaucoma but doubt. Pt denies acute change  in visual acuity and eye exam unremarkable. Doubt CO poisoning. No contacts with similar symptoms. Doubt venous thrombosis. Doubt carotid or vertebral arteries dissection. Symptoms improved with meds. Feel that can be safely discharged, but strict return precautions discussed. Outpt fu.         Raeford Razor, MD 05/19/12 [phone]

## 2012-05-19 NOTE — ED Notes (Signed)
Pt reports she has been taking extra strength Tylenol and Excedrin migraine.  She reports she last took two extra strength Tylenol roughly two hours ago.

## 2012-08-06 ENCOUNTER — Emergency Department (HOSPITAL_COMMUNITY)
Admission: EM | Admit: 2012-08-06 | Discharge: 2012-08-06 | Discharge status: Against Medical Advice | Payer: Self-pay | Attending: Emergency Medicine | Admitting: Emergency Medicine

## 2012-08-06 ENCOUNTER — Encounter (HOSPITAL_COMMUNITY): Payer: Self-pay | Admitting: Emergency Medicine

## 2012-08-06 DIAGNOSIS — R51 Headache: Secondary | ICD-10-CM | POA: Insufficient documentation

## 2012-08-06 DIAGNOSIS — R110 Nausea: Secondary | ICD-10-CM | POA: Insufficient documentation

## 2012-08-06 DIAGNOSIS — Z87891 Personal history of nicotine dependence: Secondary | ICD-10-CM | POA: Insufficient documentation

## 2012-08-06 DIAGNOSIS — H53149 Visual discomfort, unspecified: Secondary | ICD-10-CM | POA: Insufficient documentation

## 2012-08-06 MED ORDER — METOCLOPRAMIDE HCL 5 MG/ML IJ SOLN
10.0000 mg | Freq: Once | INTRAMUSCULAR | Status: AC
  Administered 2012-08-06: 10 mg via INTRAVENOUS
  Filled 2012-08-06: qty 2

## 2012-08-06 MED ORDER — DIPHENHYDRAMINE HCL 50 MG/ML IJ SOLN
25.0000 mg | Freq: Once | INTRAMUSCULAR | Status: AC
  Administered 2012-08-06: 25 mg via INTRAVENOUS
  Filled 2012-08-06: qty 1

## 2012-08-06 MED ORDER — DEXAMETHASONE SODIUM PHOSPHATE 4 MG/ML IJ SOLN
10.0000 mg | Freq: Once | INTRAMUSCULAR | Status: AC
  Administered 2012-08-06: 4 mg via INTRAVENOUS
  Filled 2012-08-06: qty 1

## 2012-08-06 MED ORDER — SODIUM CHLORIDE 0.9 % IV SOLN
Freq: Once | INTRAVENOUS | Status: AC
  Administered 2012-08-06: 1000 mL via INTRAVENOUS

## 2012-08-06 NOTE — ED Provider Notes (Signed)
History     CSN: [id number]  Arrival date & time 08/06/12  2209   First MD Initiated Contact with Patient 08/06/12 2217      Chief Complaint  Patient presents with  . Headache  . Nausea    (Consider location/radiation/quality/duration/timing/severity/associated sxs/prior treatment) Patient is a 32 y.o. female presenting with headaches. The history is provided by the patient.  Headache Pain location:  R temporal Quality:  Stabbing Radiates to:  Face Severity currently:  7/10 Severity at highest:  7/10 Onset quality:  Gradual Duration:  16 hours Progression:  Worsening Chronicity:  New Similar to prior headaches: yes   Relieved by:  Nothing Worsened by:  Light and sound Ineffective treatments:  Aspirin Associated symptoms: nausea and photophobia   Associated symptoms: no blurred vision, no cough, no ear pain, no fever, no near-syncope, no neck stiffness, no numbness, no seizures, no sinus pressure, no tingling and no URI    Michelle Mcdowell is a 32 y.o. female who presents to the ED with a headache. The headache started early in the morning. She took Excedrin without relief. The pain has gotten worse since onset. The pain is located on the right side of the head and goes around the right eye. This is similar to migraines in the past.    Past Medical History  Diagnosis Date  . Migraine     Past Surgical History  Procedure Laterality Date  . Back surgery    . Knee surgery    . Tubal ligation  05/09/2011    Procedure: POST PARTUM TUBAL LIGATION;  Surgeon: Catalina Antigua, MD;  Location: [facility];  Service: Gynecology;  Laterality: N/A;  . Cosmetic surgery      History reviewed. No pertinent family history.  History  Substance Use Topics  . Smoking status: Former Games developer  . Smokeless tobacco: Never Used  . Alcohol Use: No    OB History   Grav Para Term Preterm Abortions TAB SAB Ect Mult Living   6 3 3  3  3   3       Review of Systems  Constitutional: Negative for  fever.  HENT: Negative for ear pain, neck stiffness and sinus pressure.   Eyes: Positive for photophobia. Negative for blurred vision.  Respiratory: Negative for cough.   Cardiovascular: Negative for near-syncope.  Gastrointestinal: Positive for nausea.  Neurological: Positive for headaches. Negative for seizures and numbness.    Allergies  Review of patient's allergies indicates no known allergies.  Home Medications   Current Outpatient Rx  Name  Route  Sig  Dispense  Refill  . acetaminophen-codeine (TYLENOL #3) 300-30 MG per tablet   Oral   Take 1 tablet by mouth every 4 (four) hours as needed (cough and pain).   15 tablet   0   . guaiFENesin (MUCINEX) 600 MG 12 hr tablet   Oral   Take 600 mg by mouth 2 (two) times daily as needed. For cough         . Guaifenesin 1200 MG TB12   Oral   Take 1 tablet (1,200 mg total) by mouth 2 (two) times daily.   20 each   0   . ibuprofen (ADVIL,MOTRIN) 800 MG tablet   Oral   Take 1 tablet (800 mg total) by mouth every 8 (eight) hours as needed for pain.   21 tablet   0     BP 119/76  Pulse 88  Temp(Src) 98.6 F (37 C) (Oral)  Resp 21  Ht 5\' 2"  (1.575 m)  Wt 232 lb (105.235 kg)  BMI 42.42 kg/m2  SpO2 98%  LMP 07/23/2012  Physical Exam  Nursing note and vitals reviewed. Constitutional: She is oriented to person, place, and time. She appears well-developed and well-nourished.  HENT:  Head: Normocephalic and atraumatic.  Mouth/Throat: Uvula is midline, oropharynx is clear and moist and mucous membranes are normal.  Eyes: Conjunctivae and EOM are normal. Left eye exhibits no discharge.  Patient does not want to open her eyes due to the bright light making the headache worse.   Neck: Normal range of motion. Neck supple.  Cardiovascular: Normal rate and regular rhythm.   Pulmonary/Chest: Effort normal and breath sounds normal.  Abdominal: Soft. Bowel sounds are normal. There is no tenderness.  Musculoskeletal: Normal range  of motion.  Neurological: She is alert and oriented to person, place, and time. No cranial nerve deficit.  Ambulatory to the ED without difficulty.  Skin: Skin is warm and dry.  Psychiatric: She has a normal mood and affect. Her behavior is normal. Judgment and thought content normal.    ED Course  Procedures (including critical care time) The patient wants something for her headache before I finish her exam. I discussed with the patient will treat her symptoms with IV fluids and medication and then I will return to repeat her exam and have her stand and do further neuro exam.   Assessment: 32 y.o. female with headache similar to usual migraines.    Plan:  Normal saline IV, Benadryl 25 mg, Decadron 10 mg. And Reglan 10 mg IV  MDM  IV started and medications given. RN went back in the room and the patient had taken her IV out and left. RN saw patient going down the hall and called to her but the patient continued to walk quickly away without answering.        Janne Napoleon, [place] 08/06/12 [phone]

## 2012-08-06 NOTE — ED Notes (Addendum)
Pt abruptly and w/o speaking to anyone, left ED rapidly ambulatory. She passed me in the waiting room on the way out, I called her name and she kept on walking. I found her IV on the bed, catheter intact.

## 2012-08-06 NOTE — ED Notes (Signed)
Pt c/o headache with nausea since 0400.

## 2012-08-07 NOTE — ED Provider Notes (Signed)
Medical screening examination/treatment/procedure(s) were performed by non-physician practitioner and as supervising physician I was immediately available for consultation/collaboration.   Loren Racer, MD 08/07/12 1626

## 2014-02-09 ENCOUNTER — Encounter (HOSPITAL_COMMUNITY): Payer: Self-pay | Admitting: Emergency Medicine

## 2014-04-07 ENCOUNTER — Encounter (HOSPITAL_COMMUNITY): Payer: Self-pay

## 2014-04-07 ENCOUNTER — Emergency Department (HOSPITAL_COMMUNITY)
Admission: EM | Admit: 2014-04-07 | Discharge: 2014-04-07 | Discharge status: Against Medical Advice | Payer: 59 | Attending: Emergency Medicine | Admitting: Emergency Medicine

## 2014-04-07 ENCOUNTER — Emergency Department (HOSPITAL_COMMUNITY): Payer: 59

## 2014-04-07 DIAGNOSIS — Z8679 Personal history of other diseases of the circulatory system: Secondary | ICD-10-CM | POA: Diagnosis not present

## 2014-04-07 DIAGNOSIS — Z3202 Encounter for pregnancy test, result negative: Secondary | ICD-10-CM | POA: Diagnosis not present

## 2014-04-07 DIAGNOSIS — Z87891 Personal history of nicotine dependence: Secondary | ICD-10-CM | POA: Diagnosis not present

## 2014-04-07 DIAGNOSIS — R339 Retention of urine, unspecified: Secondary | ICD-10-CM | POA: Insufficient documentation

## 2014-04-07 DIAGNOSIS — Z9851 Tubal ligation status: Secondary | ICD-10-CM | POA: Diagnosis not present

## 2014-04-07 DIAGNOSIS — M545 Low back pain: Secondary | ICD-10-CM | POA: Diagnosis present

## 2014-04-07 DIAGNOSIS — M549 Dorsalgia, unspecified: Secondary | ICD-10-CM

## 2014-04-07 LAB — URINALYSIS, ROUTINE W REFLEX MICROSCOPIC
BILIRUBIN URINE: NEGATIVE
GLUCOSE, UA: NEGATIVE mg/dL
Ketones, ur: NEGATIVE mg/dL
NITRITE: NEGATIVE
PROTEIN: NEGATIVE mg/dL
SPECIFIC GRAVITY, URINE: 1.025 (ref 1.005–1.030)
UROBILINOGEN UA: 0.2 mg/dL (ref 0.0–1.0)
pH: 5.5 (ref 5.0–8.0)

## 2014-04-07 LAB — URINE MICROSCOPIC-ADD ON

## 2014-04-07 LAB — PREGNANCY, URINE: PREG TEST UR: NEGATIVE

## 2014-04-07 MED ORDER — MORPHINE SULFATE 2 MG/ML IJ SOLN
6.0000 mg | Freq: Once | INTRAMUSCULAR | Status: AC
  Administered 2014-04-07: 6 mg via INTRAMUSCULAR
  Filled 2014-04-07: qty 3

## 2014-04-07 MED ORDER — ONDANSETRON 8 MG PO TBDP
8.0000 mg | ORAL_TABLET | Freq: Once | ORAL | Status: AC
  Administered 2014-04-07: 8 mg via ORAL
  Filled 2014-04-07: qty 1

## 2014-04-07 NOTE — ED Notes (Signed)
Pt does not want to stay for MRI; Tammy checking to see if she can schedule it outpatient.

## 2014-04-07 NOTE — ED Provider Notes (Signed)
CSN: [id number]     Arrival date & time 04/07/14  [id number] History   First MD Initiated Contact with Patient 04/07/14 1037     Chief Complaint  Patient presents with  . Back Pain     (Consider location/radiation/quality/duration/timing/severity/associated sxs/prior Treatment) HPI   Michelle Mcdowell is a 33 y.o. female who presents to the Emergency Department complaining of low back pain for one week.  She reports hx of similar symptoms, but now c/o pain radiating into her left leg and difficulty urinating on the morning of ED arrival.  She states that she was finally able to void, but has not had urinary retention previously.  She has taken OTC medications without relief.  Pain is worse with movement and only improves slightly with rest.  She denies recent injury, fever, chills, incontinence of bladder or bowel, extremity weakness, or abdominal pain   Past Medical History  Diagnosis Date  . Migraine    Past Surgical History  Procedure Laterality Date  . Back surgery    . Knee surgery    . Tubal ligation  05/09/2011    Procedure: POST PARTUM TUBAL LIGATION;  Surgeon: Catalina AntiguaPeggy Constant, MD;  Location: [facility];  Service: Gynecology;  Laterality: N/A;  . Cosmetic surgery     No family history on file. History  Substance Use Topics  . Smoking status: Former Games developermoker  . Smokeless tobacco: Never Used  . Alcohol Use: No   OB History    Gravida Para Term Preterm AB TAB SAB Ectopic Multiple Living   6 3 3  3  3   3      Review of Systems  Constitutional: Negative for fever.  Respiratory: Negative for shortness of breath.   Gastrointestinal: Negative for nausea, vomiting, abdominal pain, constipation and abdominal distention.  Genitourinary: Positive for difficulty urinating. Negative for dysuria, hematuria, flank pain and decreased urine volume.       Low back pain  Musculoskeletal: Positive for back pain. Negative for joint swelling.  Skin: Negative for rash.  Neurological: Negative for  weakness and numbness.  All other systems reviewed and are negative.     Allergies  Review of patient's allergies indicates no known allergies.  Home Medications   Prior to Admission medications   Medication Sig Start Date End Date Taking? Authorizing Provider  acetaminophen (TYLENOL) 500 MG tablet Take 1,500 mg by mouth every 6 (six) hours as needed for moderate pain.   Yes Historical Provider, MD  HYDROcodone-acetaminophen (NORCO/VICODIN) 5-325 MG per tablet Take 1 tablet by mouth every 6 (six) hours as needed for moderate pain.   Yes Historical Provider, MD  acetaminophen-codeine (TYLENOL #3) 300-30 MG per tablet Take 1 tablet by mouth every 4 (four) hours as needed (cough and pain). Patient not taking: Reported on 04/07/2014 04/25/12   Jamesetta Orleanshristopher W Lawyer, PA-C  Guaifenesin 1200 MG TB12 Take 1 tablet (1,200 mg total) by mouth 2 (two) times daily. Patient not taking: Reported on 04/07/2014 04/25/12   Jamesetta Orleanshristopher W Lawyer, PA-C  ibuprofen (ADVIL,MOTRIN) 800 MG tablet Take 1 tablet (800 mg total) by mouth every 8 (eight) hours as needed for pain. Patient not taking: Reported on 04/07/2014 04/25/12   Jamesetta Orleanshristopher W Lawyer, PA-C   BP 102/69 mmHg  Pulse 86  Temp(Src) 98.1 F (36.7 C) (Oral)  Resp 20  Ht 5\' 2"  (1.575 m)  Wt 222 lb (100.699 kg)  BMI 40.59 kg/m2  SpO2 100%  LMP 03/31/2014 Physical Exam  Constitutional: She is oriented to person,  place, and time. She appears well-developed and well-nourished. No distress.  HENT:  Head: Normocephalic and atraumatic.  Neck: Normal range of motion. Neck supple.  Cardiovascular: Normal rate, regular rhythm, normal heart sounds and intact distal pulses.   No murmur heard. Pulmonary/Chest: Effort normal and breath sounds normal. No respiratory distress.  Abdominal: Soft. She exhibits no distension and no mass. There is no tenderness. There is no rebound and no guarding.  Genitourinary:  Digital rectal exam performed, nml rectal tone , no  masses  Musculoskeletal: She exhibits no edema.       Lumbar back: She exhibits tenderness and pain. She exhibits normal range of motion, no swelling, no deformity, no laceration and normal pulse.  ttp of the lower lumbar spine and left paraspinal muscles.    DP pulses are brisk and symmetrical.  Distal sensation intact.  Hip Flexors/Extensors are intact.  Pt has 5/5 strength against resistance of bilateral lower extremities.     Neurological: She is alert and oriented to person, place, and time. She has normal strength. No sensory deficit. She exhibits normal muscle tone. Coordination and gait normal.  Reflex Scores:      Patellar reflexes are 2+ on the right side and 2+ on the left side.      Achilles reflexes are 2+ on the right side and 2+ on the left side. Skin: Skin is warm and dry. No rash noted.  Nursing note and vitals reviewed.   ED Course  Procedures (including critical care time) Labs Review Labs Reviewed  URINALYSIS, ROUTINE W REFLEX MICROSCOPIC - Abnormal; Notable for the following:    Hgb urine dipstick TRACE (*)    Leukocytes, UA SMALL (*)    All other components within normal limits  URINE MICROSCOPIC-ADD ON - Abnormal; Notable for the following:    Squamous Epithelial / LPF FEW (*)    Bacteria, UA FEW (*)    All other components within normal limits  PREGNANCY, URINE    Imaging Review No results found.   EKG Interpretation None      MDM   Final diagnoses:  Back pain  Urinary retention    Patient with radicular low back pain and new onset of urinary retention.  Sx's concerning for possible emergent neurological process although patient does not have significant focal neurological deficits on exam.  She has ambulated to the restroom with a steady gait.  I have discussed my concerns with the patient and her family member and recommended MRI of L spine.  Pt states that she has to pick up her child and does not want to stay to have the MRI done.  I have  explained the risks involved of worsening of her symptoms and she prefers to sign out AMA.  She agrees to return here if sx's worsen or she changes her mind.      Elienai Gailey L. Trisha Mangleriplett, PA-C 04/08/14 2147  Layla MawKristen N Ward, DO 04/09/14 [phone]

## 2014-04-07 NOTE — ED Notes (Signed)
Pt reports has history of back pain and back surgery.  Also reports history of kidney stones.  Pt says approx 1 week ago, pain started in left lower back radiating down left leg.  Pt says this morning had difficulty urinating initially but then was able to void after running water.  Pt says does not feel like her kidney stone pain.

## 2014-04-08 LAB — URINE CULTURE: Colony Count: 6000

## 2015-05-27 ENCOUNTER — Emergency Department (HOSPITAL_COMMUNITY)
Admission: EM | Admit: 2015-05-27 | Discharge: 2015-05-27 | Disposition: A | Discharge status: Home / Self Care | Payer: Self-pay | Attending: Emergency Medicine | Admitting: Emergency Medicine

## 2015-05-27 ENCOUNTER — Encounter (HOSPITAL_COMMUNITY): Payer: Self-pay

## 2015-05-27 DIAGNOSIS — Z9889 Other specified postprocedural states: Secondary | ICD-10-CM | POA: Insufficient documentation

## 2015-05-27 DIAGNOSIS — M545 Low back pain, unspecified: Secondary | ICD-10-CM

## 2015-05-27 DIAGNOSIS — M62830 Muscle spasm of back: Secondary | ICD-10-CM | POA: Insufficient documentation

## 2015-05-27 DIAGNOSIS — Z87891 Personal history of nicotine dependence: Secondary | ICD-10-CM | POA: Insufficient documentation

## 2015-05-27 DIAGNOSIS — G43909 Migraine, unspecified, not intractable, without status migrainosus: Secondary | ICD-10-CM | POA: Insufficient documentation

## 2015-05-27 MED ORDER — CYCLOBENZAPRINE HCL 5 MG PO TABS: ORAL_TABLET | ORAL | Status: DC

## 2015-05-27 MED ORDER — NAPROXEN 500 MG PO TABS: ORAL_TABLET | ORAL | Status: DC

## 2015-05-27 MED ORDER — DIAZEPAM 5 MG/ML IJ SOLN
10.0000 mg | Freq: Once | INTRAMUSCULAR | Status: AC
  Administered 2015-05-27: 10 mg via INTRAMUSCULAR
  Filled 2015-05-27: qty 2

## 2015-05-27 MED ORDER — KETOROLAC TROMETHAMINE 60 MG/2ML IM SOLN
60.0000 mg | Freq: Once | INTRAMUSCULAR | Status: AC
  Administered 2015-05-27: 60 mg via INTRAMUSCULAR
  Filled 2015-05-27: qty 2

## 2015-05-27 MED ORDER — TRAMADOL HCL 50 MG PO TABS: 100.0000 mg | ORAL_TABLET | Freq: Four times a day (QID) | ORAL | Status: DC | PRN

## 2015-05-27 MED ORDER — DEXAMETHASONE SODIUM PHOSPHATE 10 MG/ML IJ SOLN
10.0000 mg | Freq: Once | INTRAMUSCULAR | Status: AC
  Administered 2015-05-27: 10 mg via INTRAMUSCULAR
  Filled 2015-05-27: qty 1

## 2015-05-27 NOTE — ED Notes (Signed)
Pt reports pain to left lower back that has been progressively worse over past few days.  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 Pt denies radiation of pain down leg.

## 2015-05-27 NOTE — ED Provider Notes (Signed)
CSN: [id number]     Arrival date & time 05/27/15  0535 History   First MD Initiated Contact with Patient 05/27/15 [phone]     Chief Complaint  Patient presents with  . Back Pain     (Consider location/radiation/quality/duration/timing/severity/associated sxs/prior Treatment) HPI patient reports a few days ago she started having some mild discomfort in her lower back and on the left side that has gotten progressively worse. She states the pain now is constant and sharp. She states any type of movement or breathing makes the pain worse, sitting still helps but does not relieve the pain. She denies radiation of pain or numbness into her extremities, she denies any urinary or rectal incontinence. She states she's had back problems before when she was 23 or 35 years old and had surgery. At that time she was having pain radiating into her leg. She states they "shaved the disc".   PCP [facility]   Past Medical History  Diagnosis Date  . Migraine    Past Surgical History  Procedure Laterality Date  . Back surgery    . Knee surgery    . Tubal ligation  05/09/2011    Procedure: POST PARTUM TUBAL LIGATION;  Surgeon: Catalina Antigua, MD;  Location: [facility];  Service: Gynecology;  Laterality: N/A;  . Cosmetic surgery     No family history on file. Social History  Substance Use Topics  . Smoking status: Former Games developer  . Smokeless tobacco: Never Used  . 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     Allergies  Review of patient's allergies indicates no known allergies.  Home Medications   Prior to Admission medications   Medication Sig Start Date End Date Taking? Authorizing Provider  acetaminophen (TYLENOL) 500 MG tablet Take 1,500 mg by mouth every 6 (six) hours as needed for  moderate pain.    Historical Provider, MD  acetaminophen-codeine (TYLENOL #3) 300-30 MG per tablet Take 1 tablet by mouth every 4 (four) hours as needed (cough and pain). Patient not taking: Reported on 04/07/2014 04/25/12   Charlestine Night, PA-C  cyclobenzaprine (FLEXERIL) 5 MG tablet Take 1 or 2 po Q 6hrs for muscle spasm pain 05/27/15   Devoria Albe, MD  Guaifenesin 1200 MG TB12 Take 1 tablet (1,200 mg total) by mouth 2 (two) times daily. Patient not taking: Reported on 04/07/2014 04/25/12   Charlestine Night, PA-C  HYDROcodone-acetaminophen (NORCO/VICODIN) 5-325 MG per tablet Take 1 tablet by mouth every 6 (six) hours as needed for moderate pain.    Historical Provider, MD  ibuprofen (ADVIL,MOTRIN) 800 MG tablet Take 1 tablet (800 mg total) by mouth every 8 (eight) hours as needed for pain. Patient not taking: Reported on 04/07/2014 04/25/12   Charlestine Night, PA-C  naproxen (NAPROSYN) 500 MG tablet Take 1 po BID with food prn pain 05/27/15   Devoria Albe, MD  traMADol (ULTRAM) 50 MG tablet Take 2 tablets (100 mg total) by mouth every 6 (six) hours as needed. 05/27/15   Devoria Albe, MD   BP 103/75 mmHg  Pulse 69  Temp(Src) 97.8 F (36.6 C) (Oral)  Resp 18  Ht  (1.575 m)  Wt 225 lb (102.059 kg)  BMI 41.14  kg/m2  SpO2 100%  LMP 05/11/2015  Vital signs normal   Physical Exam  Constitutional: She is oriented to person, place, and time. She appears well-developed and well-nourished.  Non-toxic appearance. She does not appear ill. She appears distressed.  HENT:  Head: Normocephalic and atraumatic.  Right Ear: External ear normal.  Left Ear: External ear normal.  Nose: Nose normal. No mucosal edema or rhinorrhea.  Mouth/Throat: Oropharynx is clear and moist and mucous membranes are normal. No dental abscesses or uvula swelling.  Eyes: Conjunctivae and EOM are normal. Pupils are equal, round, and reactive to light.  Neck: Normal range of motion and full passive range of motion without  pain. Neck supple.  Cardiovascular: Normal rate, regular rhythm and normal heart sounds.  Exam reveals no gallop and no friction rub.   No murmur heard. Pulmonary/Chest: Effort normal and breath sounds normal. No respiratory distress. She has no wheezes. She has no rhonchi. She has no rales. She exhibits no tenderness and no crepitus.  Abdominal: Soft. Normal appearance and bowel sounds are normal. She exhibits no distension. There is no tenderness. There is no rebound and no guarding.  Musculoskeletal: Normal range of motion. She exhibits no edema or tenderness.       Back:  Patient has tenderness of her lower lumbar spine and the left paraspinous muscles and over the left SI joint. She has very limited range of motion of the waist due to spasms of pain. She has positive straight leg raising on the left but not on the right. Her reflexes are intact.  Neurological: She is alert and oriented to person, place, and time. She has normal strength. No cranial nerve deficit.  Skin: Skin is warm, dry and intact. No rash noted. No erythema. No pallor.  Psychiatric: She has a normal mood and affect. Her speech is normal and behavior is normal. Her mood appears not anxious.  Nursing note and vitals reviewed.   ED Course  Procedures (including critical care time)  Medications  dexamethasone (DECADRON) injection 10 mg (10 mg Intramuscular Given 05/27/15 0654)  ketorolac (TORADOL) injection 60 mg (60 mg Intramuscular Given 05/27/15 0654)  diazepam (VALIUM) injection 10 mg (10 mg Intramuscular Given 05/27/15 0654)   Patient was given Decadron, Toradol, and Valium IM for her pain.  Recheck at 7:05 AM patient states her pain is improving. She was sent home with naproxen and Flexeril. We talked about not driving when she takes the higher dose of the Flexeril. She can use ice and heat on her back and Lidoderm patches OTC over the painful area.   MDM   Final diagnoses:  Acute left-sided low back pain without  sciatica    New Prescriptions   CYCLOBENZAPRINE (FLEXERIL) 5 MG TABLET    Take 1 or 2 po Q 6hrs for muscle spasm pain   NAPROXEN (NAPROSYN) 500 MG TABLET    Take 1 po BID with food prn pain   TRAMADOL (ULTRAM) 50 MG TABLET    Take 2 tablets (100 mg total) by mouth every 6 (six) hours as needed.    Plan discharge  Devoria Albe, MD, Concha Pyo, MD 05/27/15 [phone]

## 2015-05-27 NOTE — ED Notes (Signed)
Pt d/c papers and prescriptions given and reviewed. Pt. Verbalized understanding and has no further questions. Pt. Refused wheelchair. Pt. Ambulatory out of dept without difficulty, steady gait.

## 2015-05-27 NOTE — Discharge Instructions (Signed)
Use ice and heat for your back. Both will help. Take the medications as prescribed. You can use the lidocaine patches over-the-counter over the painful area. Recheck if you aren't improving over the next week. Once she were better start doing the back exercises to strengthen your back to prevent further back problems. Heat Therapy Heat therapy can help ease sore, stiff, injured, and tight muscles and joints. Heat relaxes your muscles, which may help ease your pain. Heat therapy should only be used on old, pre-existing, or long-lasting (chronic) injuries. Do not use heat therapy unless told by your doctor. HOW TO USE HEAT THERAPY There are several different kinds of heat therapy, including:  Moist heat pack.  Warm water bath.  Hot water bottle.  Electric heating pad.  Heated gel pack.  Heated wrap.  Electric heating pad. GENERAL HEAT THERAPY RECOMMENDATIONS   Do not sleep while using heat therapy. Only use heat therapy while you are awake.  Your skin may turn pink while using heat therapy. Do not use heat therapy if your skin turns red.  Do not use heat therapy if you have new pain.  High heat or long exposure to heat can cause burns. Be careful when using heat therapy to avoid burning your skin.  Do not use heat therapy on areas of your skin that are already irritated, such as with a rash or sunburn. GET HELP IF:   You have blisters, redness, swelling (puffiness), or numbness.  You have new pain.  Your pain is worse. MAKE SURE YOU:  Understand these instructions.  Will watch your condition.  Will get help right away if you are not doing well or get worse.   This information is not intended to replace advice given to you by your health care provider. Make sure you discuss any questions you have with your health care provider.   Document Released: 06/19/2011 Document Revised: 04/17/2014 Document Reviewed: 05/20/2013 Elsevier Interactive Patient Education 2016 [place] Injury Prevention Back injuries can be very painful. They can also be difficult to heal. After having one back injury, you are more likely to injure your back again. It is important to learn how to avoid injuring or re-injuring your back. The following tips can help you to prevent a back injury. WHAT SHOULD I KNOW ABOUT PHYSICAL FITNESS?  Exercise for 30 minutes per day on most days of the week or as directed by your health care provider. Make sure to:  Do aerobic exercises, such as walking, jogging, biking, or swimming.  Do exercises that increase balance and strength, such as tai chi and yoga. These can decrease your risk of falling and injuring your back.  Do stretching exercises to help with flexibility.  Try to develop strong abdominal muscles. Your abdominal muscles provide a lot of the support that is needed by your back.  Maintain a healthy weight. This helps to decrease your risk of a back injury. WHAT SHOULD I KNOW ABOUT MY DIET?  Talk with your health care provider about your overall diet. Take supplements and vitamins only as directed by your health care provider.  Talk with your health care provider about how much calcium and vitamin D you need each day. These nutrients help to prevent weakening of the bones (osteoporosis). Osteoporosis can cause broken (fractured) bones, which lead to back pain.  Include good sources of calcium in your diet, such as dairy products, green leafy vegetables, and products that have had calcium added to  them (fortified).  Include good sources of vitamin D in your diet, such as milk and foods that are fortified with vitamin D. WHAT SHOULD I KNOW ABOUT MY POSTURE?  Sit up straight and stand up straight. Avoid leaning forward when you sit or hunching over when you stand.  Choose chairs that have good low-back (lumbar) support.  If you work at a desk, sit close to it so you do not need to lean over. Keep your chin tucked in. Keep  your neck drawn back, and keep your elbows bent at a right angle. Your arms should look like the letter "L."  Sit high and close to the steering wheel when you drive. Add a lumbar support to your car seat, if needed.  Avoid sitting or standing in one position for very long. Take breaks to get up, stretch, and walk around at least one time every hour. Take breaks every hour if you are driving for long periods of time.  Sleep on your side with your knees slightly bent, or sleep on your back with a pillow under your knees. Do not lie on the front of your body to sleep. WHAT SHOULD I KNOW ABOUT LIFTING, TWISTING, AND REACHING? Lifting and Heavy Lifting  Avoid heavy lifting, especially repetitive heavy lifting. If you must do heavy lifting:  Stretch before lifting.  Work slowly.  Rest between lifts.  Use a tool such as a cart or a dolly to move objects if one is available.  Make several small trips instead of carrying one heavy load.  Ask for help when you need it, especially when moving big objects.  Follow these steps when lifting:  Stand with your feet shoulder-width apart.  Get as close to the object as you can. Do not try to pick up a heavy object that is far from your body.  Use handles or lifting straps if they are available.  Bend at your knees. Squat down, but keep your heels off the floor.  Keep your shoulders pulled back, your chin tucked in, and your back straight.  Lift the object slowly while you tighten the muscles in your legs, abdomen, and buttocks. Keep the object as close to the center of your body as possible.  Follow these steps when putting down a heavy load:  Stand with your feet shoulder-width apart.  Lower the object slowly while you tighten the muscles in your legs, abdomen, and buttocks. Keep the object as close to the center of your body as possible.  Keep your shoulders pulled back, your chin tucked in, and your back straight.  Bend at your knees.  Squat down, but keep your heels off the floor.  Use handles or lifting straps if they are available. Twisting and Reaching 1. Avoid lifting heavy objects above your waist. 2. Do not twist at your waist while you are lifting or carrying a load. If you need to turn, move your feet. 3. Do not bend over without bending at your knees. 4. Avoid reaching over your head, across a table, or for an object on a high surface. WHAT ARE SOME OTHER TIPS? 1. Avoid wet floors and icy ground. Keep sidewalks clear of ice to prevent falls. 2. Do not sleep on a mattress that is too soft or too hard. 3. Keep items that are used frequently within easy reach. 4. Put heavier objects on shelves at waist level, and put lighter objects on lower or higher shelves. 5. Find ways to decrease your stress, such  as exercise, massage, or relaxation techniques. Stress can build up in your muscles. Tense muscles are more vulnerable to injury. 6. Talk with your health care provider if you feel anxious or depressed. These conditions can make back pain worse. 7. Wear flat heel shoes with cushioned soles. 8. Avoid sudden movements. 9. Use both shoulder straps when carrying a backpack. 10. Do not use any tobacco products, including cigarettes, chewing tobacco, or electronic cigarettes. If you need help quitting, ask your health care provider.   This information is not intended to replace advice given to you by your health care provider. Make sure you discuss any questions you have with your health care provider.   Document Released: 05/04/2004 Document Revised: 08/11/2014 Document Reviewed: 03/31/2014 Elsevier Interactive Patient Education 2016 [employer]  Back Exercises The following exercises strengthen the muscles that help to support the back. They also help to keep the lower back flexible. Doing these exercises can help to prevent back pain or lessen existing pain. If you have back pain or discomfort, try doing these  exercises 2-3 times each day or as told by your health care provider. When the pain goes away, do them once each day, but increase the number of times that you repeat the steps for each exercise (do more repetitions). If you do not have back pain or discomfort, do these exercises once each day or as told by your health care provider. EXERCISES Single Knee to Chest Repeat these steps 3-5 times for each leg:  Lie on your back on a firm bed or the floor with your legs extended.  Bring one knee to your chest. Your other leg should stay extended and in contact with the floor.  Hold your knee in place by grabbing your knee or thigh.  Pull on your knee until you feel a gentle stretch in your lower back.  Hold the stretch for 10-30 seconds.  Slowly release and straighten your leg. Pelvic Tilt Repeat these steps 5-10 times:  Lie on your back on a firm bed or the floor with your legs extended.  Bend your knees so they are pointing toward the ceiling and your feet are flat on the floor.  Tighten your lower abdominal muscles to press your lower back against the floor. This motion will tilt your pelvis so your tailbone points up toward the ceiling instead of pointing to your feet or the floor.  With gentle tension and even breathing, hold this position for 5-10 seconds. Cat-Cow Repeat these steps until your lower back becomes more flexible:  Get into a hands-and-knees position on a firm surface. Keep your hands under your shoulders, and keep your knees under your hips. You may place padding under your knees for comfort.  Let your head hang down, and point your tailbone toward the floor so your lower back becomes rounded like the back of a cat.  Hold this position for 5 seconds.  Slowly lift your head and point your tailbone up toward the ceiling so your back forms a sagging arch like the back of a cow.  Hold this position for 5 seconds. Press-Ups Repeat these steps 5-10 times:  Lie on  your abdomen (face-down) on the floor.  Place your palms near your head, about shoulder-width apart.  While you keep your back as relaxed as possible and keep your hips on the floor, slowly straighten your arms to raise the top half of your body and lift your shoulders. Do not use your back muscles to  raise your upper torso. You may adjust the placement of your hands to make yourself more comfortable.  Hold this position for 5 seconds while you keep your back relaxed.  Slowly return to lying flat on the floor. Bridges Repeat these steps 10 times: 5. Lie on your back on a firm surface. 6. Bend your knees so they are pointing toward the ceiling and your feet are flat on the floor. 7. Tighten your buttocks muscles and lift your buttocks off of the floor until your waist is at almost the same height as your knees. You should feel the muscles working in your buttocks and the back of your thighs. If you do not feel these muscles, slide your feet 1-2 inches farther away from your buttocks. 8. Hold this position for 3-5 seconds. 9. Slowly lower your hips to the starting position, and allow your buttocks muscles to relax completely. If this exercise is too easy, try doing it with your arms crossed over your chest. Abdominal Crunches Repeat these steps 5-10 times: 11. Lie on your back on a firm bed or the floor with your legs extended. 12. Bend your knees so they are pointing toward the ceiling and your feet are flat on the floor. 23. Cross your arms over your chest. 14. Tip your chin slightly toward your chest without bending your neck. 21. Tighten your abdominal muscles and slowly raise your trunk (torso) high enough to lift your shoulder blades a tiny bit off of the floor. Avoid raising your torso higher than that, because it can put too much stress on your low back and it does not help to strengthen your abdominal muscles. 16. Slowly return to your starting position. Back Lifts Repeat these steps  5-10 times: 1. Lie on your abdomen (face-down) with your arms at your sides, and rest your forehead on the floor. 2. Tighten the muscles in your legs and your buttocks. 3. Slowly lift your chest off of the floor while you keep your hips pressed to the floor. Keep the back of your head in line with the curve in your back. Your eyes should be looking at the floor. 4. Hold this position for 3-5 seconds. 5. Slowly return to your starting position. SEEK MEDICAL CARE IF:  Your back pain or discomfort gets much worse when you do an exercise.  Your back pain or discomfort does not lessen within 2 hours after you exercise. If you have any of these problems, stop doing these exercises right away. Do not do them again unless your health care provider says that you can. SEEK IMMEDIATE MEDICAL CARE IF:  You develop sudden, severe back pain. If this happens, stop doing the exercises right away. Do not do them again unless your health care provider says that you can.   This information is not intended to replace advice given to you by your health care provider. Make sure you discuss any questions you have with your health care provider.   Document Released: 05/04/2004 Document Revised: 12/16/2014 Document Reviewed: 05/21/2014 Elsevier Interactive Patient Education [employer].

## 2016-08-08 ENCOUNTER — Emergency Department (HOSPITAL_COMMUNITY)
Admission: EM | Admit: 2016-08-08 | Discharge: 2016-08-08 | Disposition: A | Discharge status: Home / Self Care | Payer: BLUE CROSS/BLUE SHIELD | Attending: Emergency Medicine | Admitting: Emergency Medicine

## 2016-08-08 ENCOUNTER — Encounter (HOSPITAL_COMMUNITY): Payer: Self-pay | Admitting: Emergency Medicine

## 2016-08-08 DIAGNOSIS — Z79899 Other long term (current) drug therapy: Secondary | ICD-10-CM | POA: Diagnosis not present

## 2016-08-08 DIAGNOSIS — Z87891 Personal history of nicotine dependence: Secondary | ICD-10-CM | POA: Insufficient documentation

## 2016-08-08 DIAGNOSIS — L237 Allergic contact dermatitis due to plants, except food: Secondary | ICD-10-CM | POA: Diagnosis not present

## 2016-08-08 DIAGNOSIS — R21 Rash and other nonspecific skin eruption: Secondary | ICD-10-CM | POA: Diagnosis present

## 2016-08-08 MED ORDER — METHYLPREDNISOLONE SODIUM SUCC 125 MG IJ SOLR
125.0000 mg | Freq: Once | INTRAMUSCULAR | Status: AC
  Administered 2016-08-08: 125 mg via INTRAMUSCULAR
  Filled 2016-08-08: qty 2

## 2016-08-08 MED ORDER — SULFAMETHOXAZOLE-TRIMETHOPRIM 800-160 MG PO TABS: 1.0000 | ORAL_TABLET | Freq: Two times a day (BID) | ORAL | 0 refills | Status: AC

## 2016-08-08 MED ORDER — PREDNISONE 10 MG PO TABS: ORAL_TABLET | ORAL | 0 refills | Status: DC

## 2016-08-08 NOTE — ED Notes (Signed)
Patient given discharge instruction, verbalized understand. Patient ambulatory out of the department.  

## 2016-08-08 NOTE — ED Provider Notes (Signed)
[facility] Provider Note   CSN: [id number] Arrival date & time: 08/08/16  1541     History   Chief Complaint Chief Complaint  Patient presents with  . Rash    HPI Michelle Mcdowell is a 36 y.o. female.  The history is provided by the patient. No language interpreter was used.  Rash   This is a new problem. The current episode started more than 2 days ago. The problem has been gradually worsening. The problem is associated with plant contact. The rash is present on the right arm, left arm and abdomen. The pain is moderate. The pain has been constant since onset. Associated symptoms include itching and pain. She has tried nothing for the symptoms. The treatment provided no relief.  Pt complains of poison ivy.  Pt reports multiple areas but now increased redness and swelling around rash on right arm   Past Medical History:  Diagnosis Date  . Migraine     Patient Active Problem List   Diagnosis Date Noted  . Supervision of normal pregnancy 05/08/2011  . Oligohydramnios 05/08/2011  . History of fetal biophysical profile with non-stress test 05/08/2011    Past Surgical History:  Procedure Laterality Date  . BACK SURGERY    . COSMETIC SURGERY    . KNEE SURGERY    . TUBAL LIGATION  05/09/2011   Procedure: POST PARTUM TUBAL LIGATION;  Surgeon: Catalina Antigua, MD;  Location: [facility];  Service: Gynecology;  Laterality: N/A;    OB History    Gravida Para Term Preterm AB Living   SAB TAB Ectopic Multiple Live Births   3       3       Home Medications    Prior to Admission medications   Medication Sig Start Date End Date Taking? Authorizing Provider  acetaminophen (TYLENOL) 500 MG tablet Take 1,500 mg by mouth every 6 (six) hours as needed for moderate pain.    Historical Provider, MD  acetaminophen-codeine (TYLENOL #3) 300-30 MG per tablet Take 1 tablet by mouth every 4 (four) hours as needed (cough and pain). Patient not taking: Reported on 04/07/2014  04/25/12   Charlestine Night, PA-C  cyclobenzaprine (FLEXERIL) 5 MG tablet Take 1 or 2 po Q 6hrs for muscle spasm pain 05/27/15   Devoria Albe, MD  Guaifenesin 1200 MG TB12 Take 1 tablet (1,200 mg total) by mouth 2 (two) times daily. Patient not taking: Reported on 04/07/2014 04/25/12   Charlestine Night, PA-C  HYDROcodone-acetaminophen (NORCO/VICODIN) 5-325 MG per tablet Take 1 tablet by mouth every 6 (six) hours as needed for moderate pain.    Historical Provider, MD  ibuprofen (ADVIL,MOTRIN) 800 MG tablet Take 1 tablet (800 mg total) by mouth every 8 (eight) hours as needed for pain. Patient not taking: Reported on 04/07/2014 04/25/12   Charlestine Night, PA-C  naproxen (NAPROSYN) 500 MG tablet Take 1 po BID with food prn pain 05/27/15   Devoria Albe, MD  predniSONE (DELTASONE) 10 MG tablet 6,6,5,5,4,4,3,3,2,2,1,1, taper 08/08/16   Elson Areas, PA-C  sulfamethoxazole-trimethoprim (BACTRIM DS,SEPTRA DS) 800-160 MG tablet Take 1 tablet by mouth 2 (two) times daily. 08/08/16 08/15/16  Elson Areas, PA-C  traMADol (ULTRAM) 50 MG tablet Take 2 tablets (100 mg total) by mouth every 6 (six) hours as needed. 05/27/15   Devoria Albe, MD    Family History History reviewed. No pertinent family history.  Social History Social History  Substance Use Topics  .  Smoking status: Former Games developer  . Smokeless tobacco: Never Used  . Alcohol use No     Allergies   Patient has no known allergies.   Review of Systems Review of Systems  Skin: Positive for itching and rash.  All other systems reviewed and are negative.    Physical Exam Updated Vital Signs BP 130/88 (BP Location: Left Arm)   Pulse 87   Temp 98.8 F (37.1 C) (Oral)   Resp 15   Ht  (1.575 m)   Wt 106.6 kg   LMP 08/07/2016   SpO2 99%   BMI 42.98 kg/m   Physical Exam  Constitutional: She appears well-developed and well-nourished.  HENT:  Head: Normocephalic and atraumatic.  Right Ear: External ear normal.  Left Ear: External ear  normal.  Mouth/Throat: Oropharynx is clear and moist.  Eyes: Conjunctivae are normal. Pupils are equal, round, and reactive to light.  Neck: Normal range of motion.  Cardiovascular: Normal rate.   Pulmonary/Chest: Effort normal.  Abdominal: Soft.  Musculoskeletal: Normal range of motion.  Neurological: She is alert.  Skin: Rash noted. There is erythema.  Redness right forearm  Red raised rash 4cm area center,  Multiple scattered areas of rash abdomen, legs.  Psychiatric: She has a normal mood and affect.  Nursing note and vitals reviewed.    ED Treatments / Results  Labs (all labs ordered are listed, but only abnormal results are displayed) Labs Reviewed - No data to display  EKG  EKG Interpretation None       Radiology No results found.  Procedures Procedures (including critical care time)  Medications Ordered in ED Medications  methylPREDNISolone sodium succinate (SOLU-MEDROL) 125 mg/2 mL injection 125 mg (125 mg Intramuscular Given 08/08/16 1712)     Initial Impression / Assessment and Plan / ED Course  I have reviewed the triage vital signs and the nursing notes.  Pertinent labs & imaging results that were available during my care of the patient were reviewed by me and considered in my medical decision making (see chart for details).     Pt given solumedrol.  I am concerned pt is developing secondary infection on right arm  I will treat with prednisone and bactrim.   Pt advised to watch for increasing sign of infection  Final Clinical Impressions(s) / ED Diagnoses   Final diagnoses:  Rash  Poison ivy    New Prescriptions Discharge Medication List as of 08/08/2016  5:30 PM    START taking these medications   Details  predniSONE (DELTASONE) 10 MG tablet 6,6,5,5,4,4,3,3,2,2,1,1, taper, Print    sulfamethoxazole-trimethoprim (BACTRIM DS,SEPTRA DS) 800-160 MG tablet Take 1 tablet by mouth 2 (two) times daily., Starting Tue 08/08/2016, Until Tue 08/15/2016, Print           Lonia Skinner Barnum, PA-C 08/08/16 2241    Bethann Berkshire, MD 08/09/16 [phone]

## 2016-08-08 NOTE — ED Triage Notes (Signed)
Pt reports generalized rash after cutting down trees a week ago.

## 2016-08-08 NOTE — Discharge Instructions (Signed)
Return here tomorrow for wound recheck if not improving

## 2016-12-17 ENCOUNTER — Emergency Department (HOSPITAL_COMMUNITY)
Admission: EM | Admit: 2016-12-17 | Discharge: 2016-12-17 | Disposition: A | Discharge status: Home / Self Care | Payer: BLUE CROSS/BLUE SHIELD | Attending: Emergency Medicine | Admitting: Emergency Medicine

## 2016-12-17 ENCOUNTER — Encounter (HOSPITAL_COMMUNITY): Payer: Self-pay | Admitting: *Deleted

## 2016-12-17 ENCOUNTER — Emergency Department (HOSPITAL_COMMUNITY): Payer: BLUE CROSS/BLUE SHIELD

## 2016-12-17 DIAGNOSIS — Z87891 Personal history of nicotine dependence: Secondary | ICD-10-CM | POA: Diagnosis not present

## 2016-12-17 DIAGNOSIS — M25562 Pain in left knee: Secondary | ICD-10-CM | POA: Diagnosis not present

## 2016-12-17 MED ORDER — IBUPROFEN 800 MG PO TABS: 800.0000 mg | ORAL_TABLET | Freq: Three times a day (TID) | ORAL | 0 refills | Status: DC

## 2016-12-17 MED ORDER — HYDROCODONE-ACETAMINOPHEN 5-325 MG PO TABS
1.0000 | ORAL_TABLET | Freq: Once | ORAL | Status: AC
  Administered 2016-12-17: 1 via ORAL
  Filled 2016-12-17: qty 1

## 2016-12-17 NOTE — ED Triage Notes (Signed)
Pt c/o left knee pain that started this evening, denies any injury but does state that she was dancing with her daughter earlier today,

## 2016-12-17 NOTE — Discharge Instructions (Signed)
As discussed, follow the rice protocol instructions in this summary. Take ibuprofen for pain and swelling, ice and knee brace for comfort.  Follow up with your primary care provider if symptoms persist  beyond a week.  Return sooner if symptoms worsen, swelling, pain, redness, fever, chills, numbness, weakness or other concerning symptoms in the meantime.

## 2016-12-17 NOTE — ED Notes (Signed)
Pt ambulated in hall with little difficulty. Pt still limping, but able to apply some weight to injured knee. Pt says pain has improved from when she first got to the ED.

## 2016-12-17 NOTE — ED Provider Notes (Signed)
[facility]EMERG[facility]Y DEPT Provider Note   CSN: [id number] Arrival date & time: 12/17/16  2119     History   Chief Complaint Chief Complaint  Patient presents with  . Knee Pain    HPI Michelle Mcdowell is a 36 y.o. female presenting with sudden onset left knee pain that she was attempting to stand up from the couch prior to arrival. Patient denies any trauma or injury reports dancing with her daughter prior to the episode. She reports a history of surgery to the left knee as a child with no hardware in place. Denies numbness or weakness. No erythema, warmth, fever, chills or other symptoms.  HPI  Past Medical History:  Diagnosis Date  . Migraine     Patient Active Problem List   Diagnosis Date Noted  . Supervision of normal pregnancy 05/08/2011  . Oligohydramnios 05/08/2011  . History of fetal biophysical profile with non-stress test 05/08/2011    Past Surgical History:  Procedure Laterality Date  . BACK SURGERY    . COSMETIC SURGERY    . KNEE SURGERY    . TUBAL LIGATION  05/09/2011   Procedure: POST PARTUM TUBAL LIGATION;  Surgeon: Catalina Antigua, MD;  Location: [facility];  Service: Gynecology;  Laterality: N/A;    OB History    Gravida Para Term Preterm AB Living   SAB TAB Ectopic Multiple Live Births   3       3       Home Medications    Prior to Admission medications   Medication Sig Start Date End Date Taking? Authorizing Provider  ibuprofen (ADVIL,MOTRIN) 800 MG tablet Take 1 tablet (800 mg total) by mouth 3 (three) times daily. 12/17/16   Georgiana Shore, PA-C    Family History No family history on file.  Social History Social History  Substance Use Topics  . Smoking status: Former Games developer  . Smokeless tobacco: Never Used  . Alcohol use No     Allergies   Patient has no known allergies.   Review of Systems Review of Systems  Constitutional: Negative for chills and fever.  Musculoskeletal: Positive for arthralgias. Negative for back  pain, joint swelling, myalgias, neck pain and neck stiffness.  Skin: Negative for color change, pallor, rash and wound.  Neurological: Negative for weakness and numbness.     Physical Exam Updated Vital Signs BP 122/70   Pulse 84   Temp 97.7 F (36.5 C) (Oral)   Resp 20   Ht  (1.575 m)   Wt 108.9 kg (240 lb)   LMP 11/26/2016   SpO2 98%   BMI 43.90 kg/m   Physical Exam  Constitutional: She appears well-developed and well-nourished. No distress.  Afebrile, nontoxic-appearing, lying comfortably in bed in no acute distress.  HENT:  Head: Normocephalic and atraumatic.  Eyes: Conjunctivae and EOM are normal.  Neck: Normal range of motion.  Cardiovascular: Normal rate, regular rhythm, normal heart sounds and intact distal pulses.   No murmur heard. Pulmonary/Chest: Effort normal and breath sounds normal. No respiratory distress. She has no wheezes. She has no rales.  Musculoskeletal: Normal range of motion. She exhibits tenderness. She exhibits no edema or deformity.  Patient was able to flex the knee with great difficulty and severe pain. She was tearful while attempting to do this. Tender to palpation of the lateral aspect of the patella. No erythema, warmth, edema. Negative anterior drawer. Knee joint appears stable  Neurological: She is  alert. No sensory deficit. She exhibits normal muscle tone.  Skin: Skin is warm and dry. No rash noted. She is not diaphoretic. No erythema. No pallor.  Psychiatric: She has a normal mood and affect.  Nursing note and vitals reviewed.    ED Treatments / Results  Labs (all labs ordered are listed, but only abnormal results are displayed) Labs Reviewed - No data to display  EKG  EKG Interpretation None       Radiology Dg Knee Complete 4 Views Left  Result Date: 12/17/2016 CLINICAL DATA:  Left knee pain starting this evening. No injury. Sudden onset. EXAM: LEFT KNEE - COMPLETE 4+ VIEW COMPARISON:  08/13/2004 FINDINGS: No evidence  of fracture, dislocation, or joint effusion. No evidence of arthropathy or other focal bone abnormality. Soft tissues are unremarkable. IMPRESSION: Negative. Electronically Signed   By: Burman NievesWilliam  Stevens M.D.   On: 12/17/2016 22:16    Procedures Procedures (including critical care time)  Medications Ordered in ED Medications  HYDROcodone-acetaminophen (NORCO/VICODIN) 5-325 MG per tablet 1 tablet (1 tablet Oral Given 12/17/16 2202)     Initial Impression / Assessment and Plan / ED Course  I have reviewed the triage vital signs and the nursing notes.  Pertinent labs & imaging results that were available during my care of the patient were reviewed by me and considered in my medical decision making (see chart for details).    Patient presenting with acute severe left sided knee pain with difficulty flexing at the knee.  Neurovascularly intact distally  Pain managed while in the emergency department. Patient was able to ambulate without difficulty in ED. X-ray negative for acute abnormalities Patient improved while in ER  Knee sleeve applied, rice protocol indicated and discussed. Discharge home with symptomatic relief and follow up with PCP.  Return precautions discussed and understood. Patient agrees with discharge plan. Final Clinical Impressions(s) / ED Diagnoses   Final diagnoses:  Acute pain of left knee    New Prescriptions Discharge Medication List as of 12/17/2016 11:16 PM       Georgiana ShoreMitchell, Seana Underwood B, PA-C 12/18/16 0033    Loren RacerYelverton, David, MD 12/22/16 [id number]

## 2018-01-29 ENCOUNTER — Encounter: Payer: Self-pay | Admitting: Obstetrics & Gynecology

## 2018-02-08 ENCOUNTER — Other Ambulatory Visit: Payer: Self-pay

## 2018-02-08 ENCOUNTER — Encounter (HOSPITAL_COMMUNITY): Payer: Self-pay | Admitting: Emergency Medicine

## 2018-02-08 ENCOUNTER — Emergency Department (HOSPITAL_COMMUNITY)
Admission: EM | Admit: 2018-02-08 | Discharge: 2018-02-08 | Disposition: A | Discharge status: Home / Self Care | Payer: BLUE CROSS/BLUE SHIELD | Attending: Emergency Medicine | Admitting: Emergency Medicine

## 2018-02-08 DIAGNOSIS — M545 Low back pain, unspecified: Secondary | ICD-10-CM

## 2018-02-08 DIAGNOSIS — Z87891 Personal history of nicotine dependence: Secondary | ICD-10-CM | POA: Insufficient documentation

## 2018-02-08 HISTORY — DX: Dorsalgia, unspecified: M54.9

## 2018-02-08 MED ORDER — TRAMADOL HCL 50 MG PO TABS: 50.0000 mg | ORAL_TABLET | Freq: Three times a day (TID) | ORAL | 0 refills | Status: DC | PRN

## 2018-02-08 MED ORDER — OXYCODONE-ACETAMINOPHEN 5-325 MG PO TABS
1.0000 | ORAL_TABLET | Freq: Once | ORAL | Status: AC
  Administered 2018-02-08: 1 via ORAL
  Filled 2018-02-08: qty 1

## 2018-02-08 MED ORDER — MELOXICAM 15 MG PO TABS: 15.0000 mg | ORAL_TABLET | Freq: Every day | ORAL | 0 refills | Status: DC

## 2018-02-08 MED ORDER — KETOROLAC TROMETHAMINE 60 MG/2ML IM SOLN
30.0000 mg | Freq: Once | INTRAMUSCULAR | Status: AC
  Administered 2018-02-08: 30 mg via INTRAMUSCULAR
  Filled 2018-02-08: qty 2

## 2018-02-08 MED ORDER — DEXAMETHASONE 4 MG PO TABS
8.0000 mg | ORAL_TABLET | Freq: Once | ORAL | Status: AC
  Administered 2018-02-08: 8 mg via ORAL
  Filled 2018-02-08: qty 2

## 2018-02-08 NOTE — ED Triage Notes (Signed)
Patient complaining of lower back pain since yesterday. States she recently moved heavy equipment. States she has history of back pain.

## 2018-02-08 NOTE — ED Provider Notes (Signed)
[facility] EMERGENCY DEPARTMENT Provider Note   CSN: [id number] Arrival date & time: 02/08/18  0825     History   Chief Complaint Chief Complaint  Patient presents with  . Back Pain    HPI Michelle Mcdowell is a 37 y.o. female.  HPI   37 year old female with lower back pain.  Left lower back.  Onset yesterday.  No trauma, but she did recently moved to an did a lot of heavy lifting.  Dull ache at rest.  Worse with movements and walking.  Sometimes radiates into her posterior thigh.  No numbness or tingling.  No loss of strength.  No urinary complaints.  She has had similar back pain intermittently for years.  Past Medical History:  Diagnosis Date  . Back pain   . Migraine     Patient Active Problem List   Diagnosis Date Noted  . Supervision of normal pregnancy 05/08/2011  . Oligohydramnios 05/08/2011  . History of fetal biophysical profile with non-stress test 05/08/2011    Past Surgical History:  Procedure Laterality Date  . BACK SURGERY    . COSMETIC SURGERY    . KNEE SURGERY    . TUBAL LIGATION  05/09/2011   Procedure: POST PARTUM TUBAL LIGATION;  Surgeon: Catalina Antigua, MD;  Location: [facility];  Service: Gynecology;  Laterality: N/A;     OB History    Gravida  6   Para  3   Term  3   Preterm      AB  3   Living  3     SAB  3   TAB      Ectopic      Multiple      Live Births  3            Home Medications    Prior to Admission medications   Medication Sig Start Date End Date Taking? Authorizing Provider  ibuprofen (ADVIL,MOTRIN) 800 MG tablet Take 1 tablet (800 mg total) by mouth 3 (three) times daily. 12/17/16   Georgiana Shore, PA-C  meloxicam (MOBIC) 15 MG tablet Take 1 tablet (15 mg total) by mouth daily. 02/08/18   Raeford Razor, MD  traMADol (ULTRAM) 50 MG tablet Take 1 tablet (50 mg total) by mouth every 8 (eight) hours as needed. 02/08/18   Raeford Razor, MD    Family History History reviewed. No pertinent family  history.  Social History Social History   Tobacco Use  . Smoking status: Former Games developer  . Smokeless tobacco: Never Used  Substance Use Topics  . Alcohol use: No  . Drug use: No     Allergies   Patient has no known allergies.   Review of Systems Review of Systems  All systems reviewed and negative, other than as noted in HPI.  Physical Exam Updated Vital Signs BP 132/83 (BP Location: Left Arm)   Pulse 65   Temp 97.6 F (36.4 C) (Oral)   Resp 18   Ht 5\' 2"  (1.575 m)   Wt 108.9 kg   LMP 02/01/2018   SpO2 100%   BMI 43.90 kg/m   Physical Exam  Constitutional: She appears well-developed and well-nourished. No distress.  HENT:  Head: Normocephalic and atraumatic.  Eyes: Conjunctivae are normal. Right eye exhibits no discharge. Left eye exhibits no discharge.  Neck: Neck supple.  Cardiovascular: Normal rate, regular rhythm and normal heart sounds. Exam reveals no gallop and no friction rub.  No murmur heard. Pulmonary/Chest: Effort normal and breath sounds  normal. No respiratory distress.  Abdominal: Soft. She exhibits no distension. There is no tenderness.  Musculoskeletal: She exhibits no edema.       Arms: Tenderness to palpation in the pictured area.  No midline spinal tenderness.  No overlying skin changes.  Strength is 5 out of 5 bilateral lower extremities.  Sensation intact light touch.  Neurological: She is alert.  Skin: Skin is warm and dry.  Psychiatric: She has a normal mood and affect. Her behavior is normal. Thought content normal.  Nursing note and vitals reviewed.    ED Treatments / Results  Labs (all labs ordered are listed, but only abnormal results are displayed) Labs Reviewed - No data to display  EKG None  Radiology No results found.  Procedures Procedures (including critical care time)  Medications Ordered in ED Medications  dexamethasone (DECADRON) tablet 8 mg (has no administration in time range)  ketorolac (TORADOL) injection  30 mg (has no administration in time range)  oxyCODONE-acetaminophen (PERCOCET/ROXICET) 5-325 MG per tablet 1 tablet (has no administration in time range)     Initial Impression / Assessment and Plan / ED Course  I have reviewed the triage vital signs and the nursing notes.  Pertinent labs & imaging results that were available during my care of the patient were reviewed by me and considered in my medical decision making (see chart for details).     37 year old female with atraumatic lower back pain with no neurological complaints.  Exam is reassuring.  Likely musculoskeletal.  Plan symptomatic treatment.  Return precautions discussed.  Final Clinical Impressions(s) / ED Diagnoses   Final diagnoses:  Acute left-sided low back pain without sciatica    ED Discharge Orders         Ordered    meloxicam (MOBIC) 15 MG tablet  Daily     02/08/18 0843    traMADol (ULTRAM) 50 MG tablet  Every 8 hours PRN     02/08/18 0843           Raeford Razor, MD 02/08/18 [phone]

## 2018-02-12 ENCOUNTER — Encounter: Payer: Self-pay | Admitting: Obstetrics & Gynecology

## 2018-02-22 ENCOUNTER — Encounter: Payer: Self-pay | Admitting: *Deleted

## 2018-02-22 ENCOUNTER — Encounter: Payer: Self-pay | Admitting: Obstetrics & Gynecology

## 2018-06-06 ENCOUNTER — Other Ambulatory Visit: Payer: Self-pay

## 2018-06-06 ENCOUNTER — Encounter (HOSPITAL_COMMUNITY): Payer: Self-pay | Admitting: Emergency Medicine

## 2018-06-06 ENCOUNTER — Emergency Department (HOSPITAL_COMMUNITY)
Admission: EM | Admit: 2018-06-06 | Discharge: 2018-06-06 | Disposition: A | Discharge status: Home / Self Care | Payer: Self-pay | Attending: Emergency Medicine | Admitting: Emergency Medicine

## 2018-06-06 DIAGNOSIS — M5441 Lumbago with sciatica, right side: Secondary | ICD-10-CM | POA: Insufficient documentation

## 2018-06-06 DIAGNOSIS — Z87891 Personal history of nicotine dependence: Secondary | ICD-10-CM | POA: Insufficient documentation

## 2018-06-06 DIAGNOSIS — Z79899 Other long term (current) drug therapy: Secondary | ICD-10-CM | POA: Insufficient documentation

## 2018-06-06 DIAGNOSIS — M5440 Lumbago with sciatica, unspecified side: Secondary | ICD-10-CM

## 2018-06-06 MED ORDER — PREDNISONE 10 MG PO TABS: 20.0000 mg | ORAL_TABLET | Freq: Every day | ORAL | 0 refills | Status: DC

## 2018-06-06 MED ORDER — TRAMADOL HCL 50 MG PO TABS: 50.0000 mg | ORAL_TABLET | Freq: Four times a day (QID) | ORAL | 0 refills | Status: DC | PRN

## 2018-06-06 MED ORDER — METHYLPREDNISOLONE SODIUM SUCC 125 MG IJ SOLR
125.0000 mg | Freq: Once | INTRAMUSCULAR | Status: AC
  Administered 2018-06-06: 125 mg via INTRAVENOUS
  Filled 2018-06-06: qty 2

## 2018-06-06 MED ORDER — HYDROMORPHONE HCL 1 MG/ML IJ SOLN
0.5000 mg | Freq: Once | INTRAMUSCULAR | Status: AC
  Administered 2018-06-06: 0.5 mg via INTRAVENOUS
  Filled 2018-06-06: qty 1

## 2018-06-06 MED ORDER — KETOROLAC TROMETHAMINE 30 MG/ML IJ SOLN
30.0000 mg | Freq: Once | INTRAMUSCULAR | Status: AC
  Administered 2018-06-06: 30 mg via INTRAVENOUS
  Filled 2018-06-06: qty 1

## 2018-06-06 NOTE — ED Notes (Signed)
Patient given discharge instruction, verbalized understand. IV removed, band aid applied. Patient ambulatory out of the department.  

## 2018-06-06 NOTE — ED Provider Notes (Signed)
[facility] EMERGENCY DEPARTMENT Provider Note   CSN: [id number] Arrival date & time: 06/06/18  1425    History   Chief Complaint Chief Complaint  Patient presents with  . Back Pain    HPI Michelle Mcdowell is a 38 y.o. female.     Patient complains of low back pain radiating down her right leg.  She is been told she needs surgery for disc problems  The history is provided by the patient. No language interpreter was used.  Back Pain  Location:  Lumbar spine Quality:  Aching Radiates to:  R posterior upper leg Pain severity:  Moderate Pain is:  Same all the time Onset quality:  Sudden Progression:  Worsening Chronicity:  Recurrent Context: not emotional stress   Relieved by:  Nothing Worsened by:  Nothing Associated symptoms: no abdominal pain, no chest pain and no headaches     Past Medical History:  Diagnosis Date  . Back pain   . Migraine     Patient Active Problem List   Diagnosis Date Noted  . Supervision of normal pregnancy 05/08/2011  . Oligohydramnios 05/08/2011  . History of fetal biophysical profile with non-stress test 05/08/2011    Past Surgical History:  Procedure Laterality Date  . BACK SURGERY    . COSMETIC SURGERY    . KNEE SURGERY    . TUBAL LIGATION  05/09/2011   Procedure: POST PARTUM TUBAL LIGATION;  Surgeon: Catalina Antigua, MD;  Location: [facility];  Service: Gynecology;  Laterality: N/A;     OB History    Gravida  6   Para  3   Term  3   Preterm      AB  3   Living  3     SAB  3   TAB      Ectopic      Multiple      Live Births  3            Home Medications    Prior to Admission medications   Medication Sig Start Date End Date Taking? Authorizing Provider  ibuprofen (ADVIL,MOTRIN) 800 MG tablet Take 1 tablet (800 mg total) by mouth 3 (three) times daily. 12/17/16   Georgiana Shore, PA-C  meloxicam (MOBIC) 15 MG tablet Take 1 tablet (15 mg total) by mouth daily. 02/08/18   Raeford Razor, MD  predniSONE  (DELTASONE) 10 MG tablet Take 2 tablets (20 mg total) by mouth daily. 06/06/18   Bethann Berkshire, MD  traMADol (ULTRAM) 50 MG tablet Take 1 tablet (50 mg total) by mouth every 6 (six) hours as needed. 06/06/18   Bethann Berkshire, MD    Family History No family history on file.  Social History Social History   Tobacco Use  . Smoking status: Former Games developer  . Smokeless tobacco: Never Used  Substance Use Topics  . Alcohol use: No  . Drug use: No     Allergies   Patient has no known allergies.   Review of Systems Review of Systems  Constitutional: Negative for appetite change and fatigue.  HENT: Negative for congestion, ear discharge and sinus pressure.   Eyes: Negative for discharge.  Respiratory: Negative for cough.   Cardiovascular: Negative for chest pain.  Gastrointestinal: Negative for abdominal pain and diarrhea.  Genitourinary: Negative for frequency and hematuria.  Musculoskeletal: Positive for back pain.  Skin: Negative for rash.  Neurological: Negative for seizures and headaches.  Psychiatric/Behavioral: Negative for hallucinations.     Physical Exam Updated Vital  Signs BP 140/84 (BP Location: Right Arm)   Pulse 75   Temp (!) 97.1 F (36.2 C) (Temporal)   Resp 14   Ht 5\' 2"  (1.575 m)   Wt 111.1 kg   LMP 05/08/2018 (Approximate)   SpO2 100%   BMI 44.81 kg/m   Physical Exam Vitals signs and nursing note reviewed.  Constitutional:      Appearance: She is well-developed.  HENT:     Head: Normocephalic.     Nose: Nose normal.  Eyes:     General: No scleral icterus.    Conjunctiva/sclera: Conjunctivae normal.  Neck:     Musculoskeletal: Neck supple.     Thyroid: No thyromegaly.  Cardiovascular:     Rate and Rhythm: Normal rate and regular rhythm.     Heart sounds: No murmur. No friction rub. No gallop.   Pulmonary:     Breath sounds: No stridor. No wheezing or rales.  Chest:     Chest wall: No tenderness.  Abdominal:     General: There is no  distension.     Tenderness: There is no abdominal tenderness. There is no rebound.  Musculoskeletal: Normal range of motion.     Comments: Tender lumbar spine with positive straight leg raise on the right  Lymphadenopathy:     Cervical: No cervical adenopathy.  Skin:    Findings: No erythema or rash.  Neurological:     Mental Status: She is oriented to person, place, and time.     Motor: No abnormal muscle tone.     Coordination: Coordination normal.  Psychiatric:        Behavior: Behavior normal.      ED Treatments / Results  Labs (all labs ordered are listed, but only abnormal results are displayed) Labs Reviewed - No data to display  EKG None  Radiology No results found.  Procedures Procedures (including critical care time)  Medications Ordered in ED Medications  methylPREDNISolone sodium succinate (SOLU-MEDROL) 125 mg/2 mL injection 125 mg (125 mg Intravenous Given 06/06/18 1701)  ketorolac (TORADOL) 30 MG/ML injection 30 mg (30 mg Intravenous Given 06/06/18 1703)  HYDROmorphone (DILAUDID) injection 0.5 mg (0.5 mg Intravenous Given 06/06/18 1706)     Initial Impression / Assessment and Plan / ED Course  I have reviewed the triage vital signs and the nursing notes.  Pertinent labs & imaging results that were available during my care of the patient were reviewed by me and considered in my medical decision making (see chart for details).       Patient's pain was improved with pain medicine and prednisone.  She will be sent home with tramadol and prednisone and will follow-up with PCP Final Clinical Impressions(s) / ED Diagnoses   Final diagnoses:  Acute right-sided low back pain with sciatica, sciatica laterality unspecified    ED Discharge Orders         Ordered    predniSONE (DELTASONE) 10 MG tablet  Daily     06/06/18 1745    traMADol (ULTRAM) 50 MG tablet  Every 6 hours PRN     06/06/18 1745           Bethann Berkshire, MD 06/06/18 [phone]

## 2018-06-06 NOTE — ED Triage Notes (Signed)
Low back x 2 days. Hx of the same.  Needs to have a surgery but cant afford to be out of work.

## 2018-06-06 NOTE — Discharge Instructions (Addendum)
Follow-up with your doctor next week if any problems 

## 2019-04-05 ENCOUNTER — Encounter (HOSPITAL_COMMUNITY): Payer: Self-pay | Admitting: Emergency Medicine

## 2019-04-05 ENCOUNTER — Other Ambulatory Visit: Payer: Self-pay

## 2019-04-05 ENCOUNTER — Emergency Department (HOSPITAL_COMMUNITY)
Admission: EM | Admit: 2019-04-05 | Discharge: 2019-04-05 | Disposition: A | Discharge status: Home / Self Care | Payer: Self-pay | Attending: Emergency Medicine | Admitting: Emergency Medicine

## 2019-04-05 DIAGNOSIS — R519 Headache, unspecified: Secondary | ICD-10-CM | POA: Insufficient documentation

## 2019-04-05 DIAGNOSIS — Z5321 Procedure and treatment not carried out due to patient leaving prior to being seen by health care provider: Secondary | ICD-10-CM | POA: Insufficient documentation

## 2019-04-05 NOTE — ED Triage Notes (Signed)
Patient states migraine x 2 days. Patient also tested positive for COVID x 2 weeks ago but not complaining of any COVID complications today.

## 2019-04-15 ENCOUNTER — Other Ambulatory Visit: Payer: Self-pay

## 2019-04-15 ENCOUNTER — Ambulatory Visit
Admission: EM | Admit: 2019-04-15 | Discharge: 2019-04-15 | Disposition: A | Discharge status: Home / Self Care | Payer: Self-pay | Attending: Emergency Medicine | Admitting: Emergency Medicine

## 2019-04-15 DIAGNOSIS — R05 Cough: Secondary | ICD-10-CM

## 2019-04-15 DIAGNOSIS — R059 Cough, unspecified: Secondary | ICD-10-CM

## 2019-04-15 DIAGNOSIS — M5441 Lumbago with sciatica, right side: Secondary | ICD-10-CM

## 2019-04-15 MED ORDER — DEXAMETHASONE SODIUM PHOSPHATE 10 MG/ML IJ SOLN
10.0000 mg | Freq: Once | INTRAMUSCULAR | Status: AC
  Administered 2019-04-15: 17:00:00 10 mg via INTRAMUSCULAR

## 2019-04-15 MED ORDER — PREDNISONE 20 MG PO TABS: 20.0000 mg | ORAL_TABLET | Freq: Two times a day (BID) | ORAL | 0 refills | Status: AC

## 2019-04-15 NOTE — ED Triage Notes (Signed)
Pt presents to UC w/ c/o middle lower back pain x3-4 days. Pt states she has hx of back pain which she needs surgery for and she normally goes to ER to get a shot.   Pt also reports residual nonproductive cough since she had covid 03/23/19.

## 2019-04-15 NOTE — Discharge Instructions (Signed)
Steroid shot given in office Continue conservative management of rest, ice, heat, and gentle stretches/ massage Take prednisone as prescribed and to completion.  This should help with your cough as well.   Follow up with PCP if back pain or/ cough persist Return or go to the ER if you have any new or worsening symptoms (fever, chills, chest pain, shortness of breath, abdominal pain, changes in bowel or bladder habits, pain radiating into lower legs, worsening symptoms despite medication etc...)

## 2019-04-15 NOTE — ED Provider Notes (Signed)
[facility]   [id number] 04/15/19 Arrival Time: 9485  CC: Back PAIN  SUBJECTIVE: History from: patient. DAYJAH SELMAN is a 39 y.o. female complains of RT low back pain x 3-4 days.  Denies a precipitating event or specific injury.  Localizes the pain to the RT low back.  Describes the pain as intermittent and achy/ sharp in character.  Has tried OTC medications without relief.  Symptoms are made worse with laying flat and movement about the lumbar spine.  Reports similar symptoms in the past that improved with steroid shot and steroid.  Denies fever, chills, erythema, ecchymosis, effusion, weakness, numbness and tingling, saddle paresthesias, loss of bowel or bladder function.      Patient also mentions residual nonproductive cough since she was diagnosed with covid on 03/23/2019  ROS: As per HPI.  All other pertinent ROS negative.     Past Medical History:  Diagnosis Date  . Back pain   . Migraine    Past Surgical History:  Procedure Laterality Date  . BACK SURGERY    . COSMETIC SURGERY    . KNEE SURGERY    . TUBAL LIGATION  05/09/2011   Procedure: POST PARTUM TUBAL LIGATION;  Surgeon: Mora Bellman, MD;  Location: [facility];  Service: Gynecology;  Laterality: N/A;   No Known Allergies No current facility-administered medications on file prior to encounter.   Current Outpatient Medications on File Prior to Encounter  Medication Sig Dispense Refill  . ibuprofen (ADVIL,MOTRIN) 800 MG tablet Take 1 tablet (800 mg total) by mouth 3 (three) times daily. 21 tablet 0   Social History   Socioeconomic History  . Marital status: Single    Spouse name: Not on file  . Number of children: Not on file  . Years of education: Not on file  . Highest education level: Not on file  Occupational History  . Not on file  Tobacco Use  . Smoking status: Former Research scientist (life sciences)  . Smokeless tobacco: Never Used  Substance and Sexual Activity  . Alcohol use: No  . Drug use: No  . Sexual  activity: Yes    Birth control/protection: None, Surgical  Other Topics Concern  . Not on file  Social History Narrative  . Not on file   Social Determinants of Health   Financial Resource Strain:   . Difficulty of Paying Living Expenses: Not on file  Food Insecurity:   . Worried About Charity fundraiser in the Last Year: Not on file  . Ran Out of Food in the Last Year: Not on file  Transportation Needs:   . Lack of Transportation (Medical): Not on file  . Lack of Transportation (Non-Medical): Not on file  Physical Activity:   . Days of Exercise per Week: Not on file  . Minutes of Exercise per Session: Not on file  Stress:   . Feeling of Stress : Not on file  Social Connections:   . Frequency of Communication with Friends and Family: Not on file  . Frequency of Social Gatherings with Friends and Family: Not on file  . Attends Religious Services: Not on file  . Active Member of Clubs or Organizations: Not on file  . Attends Archivist Meetings: Not on file  . Marital Status: Not on file  Intimate Partner Violence:   . Fear of Current or Ex-Partner: Not on file  . Emotionally Abused: Not on file  . Physically Abused: Not on file  . Sexually Abused: Not on file  Family History  Problem Relation Age of Onset  . Healthy Mother   . Healthy Father     OBJECTIVE:  Vitals:   04/15/19 1650  BP: 133/83  Pulse: 96  Resp: 18  Temp: 98.1 F (36.7 C)  TempSrc: Oral  SpO2: 96%    General appearance: ALERT; in no acute distress.  Head: NCAT Lungs: Normal respiratory effort; CTAB; mild cough CV: RRR Musculoskeletal: Back Inspection: Skin warm, dry, clear and intact without obvious erythema, effusion, or ecchymosis.  Palpation: Diffusely TTP over RT low back ROM: LROM about the lumbar spine Strength: 5/5 shld abduction, 5/5 shld adduction, 5/5 elbow flexion, 5/5 elbow extension, 5/5 grip strength, 3+/5 RT hip flexion, 5/5 LT hip flexion Skin: warm and dry  Neurologic: Ambulates without difficulty; Sensation intact about the upper/ lower extremities Psychological: alert and cooperative; normal mood and affect   ASSESSMENT & PLAN:  1. Acute right-sided low back pain with right-sided sciatica   2. Cough     Meds ordered this encounter  Medications  . predniSONE (DELTASONE) 20 MG tablet    Sig: Take 1 tablet (20 mg total) by mouth 2 (two) times daily with a meal for 5 days.    Dispense:  10 tablet    Refill:  0    Order Specific Question:   Supervising Provider    Answer:   Eustace Moore [[id number]]  . dexamethasone (DECADRON) injection 10 mg   Steroid shot given in office Continue conservative management of rest, ice, heat, and gentle stretches/ massage Take prednisone as prescribed and to completion.  This should help with your cough as well.   Follow up with PCP if back pain or/ cough persist Return or go to the ER if you have any new or worsening symptoms (fever, chills, chest pain, shortness of breath, abdominal pain, changes in bowel or bladder habits, pain radiating into lower legs, worsening symptoms despite medication etc...)    Reviewed expectations re: course of current medical issues. Questions answered. Outlined signs and symptoms indicating need for more acute intervention. Patient verbalized understanding. After Visit Summary given.    Rennis Harding, PA-C 04/15/19 1654

## 2019-06-19 ENCOUNTER — Ambulatory Visit
Admission: EM | Admit: 2019-06-19 | Discharge: 2019-06-19 | Disposition: A | Discharge status: Home / Self Care | Payer: Self-pay | Attending: Emergency Medicine | Admitting: Emergency Medicine

## 2019-06-19 ENCOUNTER — Other Ambulatory Visit: Payer: Self-pay

## 2019-06-19 DIAGNOSIS — M545 Low back pain, unspecified: Secondary | ICD-10-CM

## 2019-06-19 DIAGNOSIS — M62838 Other muscle spasm: Secondary | ICD-10-CM

## 2019-06-19 MED ORDER — DEXAMETHASONE SODIUM PHOSPHATE 10 MG/ML IJ SOLN
10.0000 mg | Freq: Once | INTRAMUSCULAR | Status: AC
  Administered 2019-06-19: 10 mg via INTRAMUSCULAR

## 2019-06-19 MED ORDER — PREDNISONE 10 MG (21) PO TBPK: ORAL_TABLET | Freq: Every day | ORAL | 0 refills | Status: DC

## 2019-06-19 NOTE — ED Triage Notes (Signed)
Pt reports history of low back pain.  Reports pain in left lower back flared up 2 days ago.  Denies injury.  Denies pain radiating down her legs but says it's difficult for her to raise her left leg due to it causing pain in her back.

## 2019-06-19 NOTE — ED Provider Notes (Signed)
[facility] CARE CENTER   [id number] 06/19/19 Arrival Time: 1435  CC: Low back pain  SUBJECTIVE: History from: patient. Michelle Mcdowell is a 39 y.o. female complains of low back pain x 2 days.  Denies a specific injury, however, does admit to strenuous cleaning.  Localizes the pain to the LT low back.  Describes the pain as intermittent and sharp in character.  Has tried OTC medications without relief.  Symptoms are made worse with ROM.  Reports similar symptoms in the past that improved with steroid.  Denies fever, chills, erythema, ecchymosis, effusion, weakness, numbness and tingling, saddle paresthesias, loss of bowel or bladder function.      ROS: As per HPI.  All other pertinent ROS negative.     Past Medical History:  Diagnosis Date  . Back pain   . Migraine    Past Surgical History:  Procedure Laterality Date  . BACK SURGERY    . COSMETIC SURGERY    . KNEE SURGERY    . TUBAL LIGATION  05/09/2011   Procedure: POST PARTUM TUBAL LIGATION;  Surgeon: Catalina Antigua, MD;  Location: [facility];  Service: Gynecology;  Laterality: N/A;   No Known Allergies No current facility-administered medications on file prior to encounter.   Current Outpatient Medications on File Prior to Encounter  Medication Sig Dispense Refill  . ibuprofen (ADVIL,MOTRIN) 800 MG tablet Take 1 tablet (800 mg total) by mouth 3 (three) times daily. 21 tablet 0   Social History   Socioeconomic History  . Marital status: Single    Spouse name: Not on file  . Number of children: Not on file  . Years of education: Not on file  . Highest education level: Not on file  Occupational History  . Not on file  Tobacco Use  . Smoking status: Former Games developer  . Smokeless tobacco: Never Used  Substance and Sexual Activity  . Alcohol use: No  . Drug use: No  . Sexual activity: Yes    Birth control/protection: None, Surgical  Other Topics Concern  . Not on file  Social History Narrative  . Not on file   Social  Determinants of Health   Financial Resource Strain:   . Difficulty of Paying Living Expenses:   Food Insecurity:   . Worried About Programme researcher, broadcasting/film/video in the Last Year:   . Barista in the Last Year:   Transportation Needs:   . Freight forwarder (Medical):   Marland Kitchen Lack of Transportation (Non-Medical):   Physical Activity:   . Days of Exercise per Week:   . Minutes of Exercise per Session:   Stress:   . Feeling of Stress :   Social Connections:   . Frequency of Communication with Friends and Family:   . Frequency of Social Gatherings with Friends and Family:   . Attends Religious Services:   . Active Member of Clubs or Organizations:   . Attends Banker Meetings:   Marland Kitchen Marital Status:   Intimate Partner Violence:   . Fear of Current or Ex-Partner:   . Emotionally Abused:   Marland Kitchen Physically Abused:   . Sexually Abused:    Family History  Problem Relation Age of Onset  . Healthy Mother   . Healthy Father     OBJECTIVE:  Vitals:   06/19/19 1442 06/19/19 1443  BP: 137/84   Pulse: (!) 101   Resp: 18   Temp: 97.8 F (36.6 C)   TempSrc: Oral   SpO2: 94%  Weight:  248 lb (112.5 kg)  Height:  5\' 2"  (1.575 m)    General appearance: ALERT; in no acute distress.  Head: NCAT Lungs: Normal respiratory effort; CTAB CV: RRR Musculoskeletal: Back  Inspection: Skin warm, dry, clear and intact without obvious erythema, effusion, or ecchymosis; tattoo present to low back Palpation: TTP over distal L-spine and LT paravertebral muscles with palpable spasm ROM: LROM about the l-spine Strength: 5/5 shld abduction, 5/5 shld adduction, 5/5 elbow flexion, 5/5 elbow extension, 5/5 grip strength, 5/5 hip flexion, 5/5 knee abduction, 5/5 knee adduction, 5/5 knee flexion, 5/5 knee extension Skin: warm and dry Neurologic: Ambulates without difficulty; Sensation intact about the upper/ lower extremities Psychological: alert and cooperative; normal mood and  affect  ASSESSMENT & PLAN:  1. Acute left-sided low back pain without sciatica   2. Muscle spasm    Meds ordered this encounter  Medications  . dexamethasone (DECADRON) injection 10 mg  . predniSONE (STERAPRED UNI-PAK 21 TAB) 10 MG (21) TBPK tablet    Sig: Take by mouth daily. Take 6 tabs by mouth daily  for 2 days, then 5 tabs for 2 days, then 4 tabs for 2 days, then 3 tabs for 2 days, 2 tabs for 2 days, then 1 tab by mouth daily for 2 days    Dispense:  42 tablet    Refill:  0    Order Specific Question:   Supervising Provider    Answer:   Raylene Everts [[id number]]    Continue conservative management of rest, ice, heat, and gentle stretches Prednisone taper prescribed.  Take as directed and to completion Use muscle relaxer at home as prescribed.   Follow up with PCP if symptoms persist Return or go to the ER if you have any new or worsening symptoms (fever, chills, chest pain, abdominal pain, changes in bowel or bladder habits, pain radiating into lower legs, etc...)   Reviewed expectations re: course of current medical issues. Questions answered. Outlined signs and symptoms indicating need for more acute intervention. Patient verbalized understanding. After Visit Summary given.    Lestine Box, PA-C 06/19/19 1454

## 2019-06-19 NOTE — Discharge Instructions (Signed)
Continue conservative management of rest, ice, heat, and gentle stretches Prednisone taper prescribed.  Take as directed and to completion Use muscle relaxor at home as prescribed.   Follow up with PCP if symptoms persist Return or go to the ER if you have any new or worsening symptoms (fever, chills, chest pain, abdominal pain, changes in bowel or bladder habits, pain radiating into lower legs, etc...)

## 2020-01-25 ENCOUNTER — Other Ambulatory Visit: Payer: Self-pay

## 2020-01-25 ENCOUNTER — Encounter (HOSPITAL_COMMUNITY): Payer: Self-pay | Admitting: Emergency Medicine

## 2020-01-25 ENCOUNTER — Emergency Department (HOSPITAL_COMMUNITY)
Admission: EM | Admit: 2020-01-25 | Discharge: 2020-01-25 | Disposition: A | Discharge status: Home / Self Care | Payer: Self-pay | Attending: Emergency Medicine | Admitting: Emergency Medicine

## 2020-01-25 DIAGNOSIS — M5441 Lumbago with sciatica, right side: Secondary | ICD-10-CM | POA: Insufficient documentation

## 2020-01-25 DIAGNOSIS — M5431 Sciatica, right side: Secondary | ICD-10-CM

## 2020-01-25 DIAGNOSIS — Z87891 Personal history of nicotine dependence: Secondary | ICD-10-CM | POA: Insufficient documentation

## 2020-01-25 MED ORDER — KETOROLAC TROMETHAMINE 30 MG/ML IJ SOLN
30.0000 mg | Freq: Once | INTRAMUSCULAR | Status: AC
  Administered 2020-01-25: 30 mg via INTRAMUSCULAR
  Filled 2020-01-25: qty 1

## 2020-01-25 MED ORDER — METHOCARBAMOL 500 MG PO TABS: 500.0000 mg | ORAL_TABLET | Freq: Three times a day (TID) | ORAL | 0 refills | Status: DC | PRN

## 2020-01-25 MED ORDER — HYDROMORPHONE HCL 2 MG/ML IJ SOLN
2.0000 mg | Freq: Once | INTRAMUSCULAR | Status: AC
  Administered 2020-01-25: 2 mg via INTRAMUSCULAR
  Filled 2020-01-25: qty 1

## 2020-01-25 NOTE — ED Provider Notes (Signed)
[facility] EMERGENCY DEPARTMENT Provider Note   CSN: [id number] Arrival date & time: 01/25/20  1911     History Chief Complaint  Patient presents with  . Back Pain    Michelle Mcdowell is a 39 y.o. female.  HPI 39 year old female presents with right-sided low back pain and radiation of pain down her right leg.  Ongoing for the past couple days.  She was pushing heavy carts at work and thinks this is what caused it.  No weakness or numbness in the extremity.  It is painful to walk but she has strength.  No bowel/bladder incontinence or fever.  Sciatica like this is a recurrent issue for her.  Has tried ibuprofen.  Past Medical History:  Diagnosis Date  . Back pain   . Migraine     Patient Active Problem List   Diagnosis Date Noted  . Supervision of normal pregnancy 05/08/2011  . Oligohydramnios 05/08/2011  . History of fetal biophysical profile with non-stress test 05/08/2011    Past Surgical History:  Procedure Laterality Date  . BACK SURGERY    . COSMETIC SURGERY    . KNEE SURGERY    . TUBAL LIGATION  05/09/2011   Procedure: POST PARTUM TUBAL LIGATION;  Surgeon: Catalina Antigua, MD;  Location: [facility];  Service: Gynecology;  Laterality: N/A;     OB History    Gravida  6   Para  3   Term  3   Preterm      AB  3   Living  3     SAB  3   TAB      Ectopic      Multiple      Live Births  3           Family History  Problem Relation Age of Onset  . Healthy Mother   . Healthy Father     Social History   Tobacco Use  . Smoking status: Former Smoker    Types: Cigarettes  . Smokeless tobacco: Never Used  Vaping Use  . Vaping Use: Never used  Substance Use Topics  . Alcohol use: No  . Drug use: No    Home Medications Prior to Admission medications   Medication Sig Start Date End Date Taking? Authorizing Provider  ibuprofen (ADVIL,MOTRIN) 800 MG tablet Take 1 tablet (800 mg total) by mouth 3 (three) times daily. 12/17/16   Georgiana Shore, PA-C  methocarbamol (ROBAXIN) 500 MG tablet Take 1 tablet (500 mg total) by mouth every 8 (eight) hours as needed for muscle spasms. 01/25/20   Pricilla Loveless, MD  predniSONE (STERAPRED UNI-PAK 21 TAB) 10 MG (21) TBPK tablet Take by mouth daily. Take 6 tabs by mouth daily  for 2 days, then 5 tabs for 2 days, then 4 tabs for 2 days, then 3 tabs for 2 days, 2 tabs for 2 days, then 1 tab by mouth daily for 2 days 06/19/19   Alvino Chapel [place], PA-C    Allergies    Patient has no known allergies.  Review of Systems   Review of Systems  Constitutional: Negative for fever.  Genitourinary:       No incontinence  Musculoskeletal: Positive for back pain.  Neurological: Negative for weakness and numbness.    Physical Exam Updated Vital Signs BP 121/73 (BP Location: Right Arm)   Pulse 79   Temp 98.3 F (36.8 C) (Oral)   Resp 18   Ht 5\' 2"  (1.575 m)   Wt  98.4 kg   LMP 01/25/2020   SpO2 100%   BMI 39.69 kg/m   Physical Exam Vitals and nursing note reviewed.  Constitutional:      Appearance: She is well-developed. She is obese.  HENT:     Head: Normocephalic and atraumatic.     Right Ear: External ear normal.     Left Ear: External ear normal.     Nose: Nose normal.  Eyes:     General:        Right eye: No discharge.        Left eye: No discharge.  Cardiovascular:     Rate and Rhythm: Normal rate and regular rhythm.     Heart sounds: Normal heart sounds.  Pulmonary:     Effort: Pulmonary effort is normal.     Breath sounds: Normal breath sounds.  Abdominal:     Palpations: Abdomen is soft.     Tenderness: There is no abdominal tenderness.  Musculoskeletal:     Lumbar back: Tenderness (right sided) present.  Skin:    General: Skin is warm and dry.  Neurological:     Mental Status: She is alert.     Comments: 5/5 strength in BLE. Normal gross sensation  Psychiatric:        Mood and Affect: Mood is not anxious.     ED Results / Procedures / Treatments   Labs (all  labs ordered are listed, but only abnormal results are displayed) Labs Reviewed - No data to display  EKG None  Radiology No results found.  Procedures Procedures (including critical care time)  Medications Ordered in ED Medications  HYDROmorphone (DILAUDID) injection 2 mg (2 mg Intramuscular Given 01/25/20 2133)  ketorolac (TORADOL) 30 MG/ML injection 30 mg (30 mg Intramuscular Given 01/25/20 2131)    ED Course  I have reviewed the triage vital signs and the nursing notes.  Pertinent labs & imaging results that were available during my care of the patient were reviewed by me and considered in my medical decision making (see chart for details).    MDM Rules/Calculators/A&P                          Patient was given IM Dilaudid and IM Toradol and has complete relief of her pain.  She has recurrent sciatica but no signs/symptoms of cauda equina/spinal cord emergency.  I do not think emergent imaging is needed.  I doubt infection.  At this point, continue NSAIDs and try muscle relaxers and follow-up with PCP.  Return precautions discussed. Final Clinical Impression(s) / ED Diagnoses Final diagnoses:  Right sided sciatica    Rx / DC Orders ED Discharge Orders         Ordered    methocarbamol (ROBAXIN) 500 MG tablet  Every 8 hours PRN        01/25/20 2256           Pricilla Loveless, MD 01/25/20 2320

## 2020-01-25 NOTE — ED Notes (Signed)
Pt sitting in wheel chair, pt states that she started having some lower back pain radiating down her R leg last night, states that she has a hx of sciatica and this feels the same, denies numbness, positive sensation to touch, no loss of bowel or bladder function.

## 2020-01-25 NOTE — Discharge Instructions (Signed)
If you develop worsening, recurrent, or continued back pain, numbness or weakness in the legs, incontinence of your bowels or bladders, numbness of your buttocks, fever, abdominal pain, or any other new/concerning symptoms then return to the ER for evaluation.  

## 2020-01-25 NOTE — ED Triage Notes (Signed)
Patient c/o lower back pain that radiates into right leg. Denies any injury. Per patient hx of sciatic problems. Patient took ibuprofen at 5pm with no relief. Denies any complications with urination or BMs.Denies any urinary symptoms. CNS intact.

## 2020-01-26 ENCOUNTER — Ambulatory Visit
Admission: EM | Admit: 2020-01-26 | Discharge: 2020-01-26 | Disposition: A | Discharge status: Home / Self Care | Payer: Self-pay | Attending: Emergency Medicine | Admitting: Emergency Medicine

## 2020-01-26 ENCOUNTER — Encounter: Payer: Self-pay | Admitting: Emergency Medicine

## 2020-01-26 DIAGNOSIS — G8929 Other chronic pain: Secondary | ICD-10-CM

## 2020-01-26 DIAGNOSIS — M5442 Lumbago with sciatica, left side: Secondary | ICD-10-CM

## 2020-01-26 DIAGNOSIS — M5441 Lumbago with sciatica, right side: Secondary | ICD-10-CM

## 2020-01-26 MED ORDER — DEXAMETHASONE SODIUM PHOSPHATE 10 MG/ML IJ SOLN
10.0000 mg | Freq: Once | INTRAMUSCULAR | Status: AC
  Administered 2020-01-26: 10 mg via INTRAMUSCULAR

## 2020-01-26 MED ORDER — PREDNISONE 10 MG (21) PO TBPK: ORAL_TABLET | ORAL | 0 refills | Status: DC

## 2020-01-26 NOTE — Discharge Instructions (Signed)
Rest, ice and heat as needed Ensure adequate ROM as tolerated. Prescribed prednisone for inflammation  Continue to take Robaxin as prescribed for muscle spasm.   Return here or go to ER if you have any new or worsening symptoms such as numbness/tingling of the inner thighs, loss of bladder or bowel control, headache/blurry vision, nausea/vomiting, confusion/altered mental status, dizziness, weakness, passing out, imbalance, etc.

## 2020-01-26 NOTE — ED Provider Notes (Signed)
[facility] CARE CENTER   [id number] 01/26/20 Arrival Time: 1779   Chief Complaint  Patient presents with  . Back Pain     SUBJECTIVE: History from: patient.  Michelle Mcdowell is a 39 y.o. female presented to the urgent care with a complaint of acute on chronic lower back pain that radiated to bilateral lower extremities for the past few days.  Patient went to ER yesterday and was prescribed Robaxin.  Reports she needed steroid.  Admits to strenuous activities.  Localized pain to lower back that radiates to bilateral legs.  She describes the pain as constant and achy.  She has tried Robaxin without relief.  Her symptoms are made worse with ROM.  She denies similar symptoms in the past.  Denies chills fever, nausea, vomiting, diarrhea.   ROS: As per HPI.  All other pertinent ROS negative.      Past Medical History:  Diagnosis Date  . Back pain   . Migraine    Past Surgical History:  Procedure Laterality Date  . BACK SURGERY    . COSMETIC SURGERY    . KNEE SURGERY    . TUBAL LIGATION  05/09/2011   Procedure: POST PARTUM TUBAL LIGATION;  Surgeon: Catalina Antigua, MD;  Location: [facility];  Service: Gynecology;  Laterality: N/A;   No Known Allergies No current facility-administered medications on file prior to encounter.   Current Outpatient Medications on File Prior to Encounter  Medication Sig Dispense Refill  . ibuprofen (ADVIL,MOTRIN) 800 MG tablet Take 1 tablet (800 mg total) by mouth 3 (three) times daily. 21 tablet 0  . methocarbamol (ROBAXIN) 500 MG tablet Take 1 tablet (500 mg total) by mouth every 8 (eight) hours as needed for muscle spasms. 15 tablet 0   Social History   Socioeconomic History  . Marital status: Single    Spouse name: Not on file  . Number of children: Not on file  . Years of education: Not on file  . Highest education level: Not on file  Occupational History  . Not on file  Tobacco Use  . Smoking status: Former Smoker    Types: Cigarettes  .  Smokeless tobacco: Never Used  Vaping Use  . Vaping Use: Never used  Substance and Sexual Activity  . Alcohol use: No  . Drug use: No  . Sexual activity: Yes    Birth control/protection: None, Surgical  Other Topics Concern  . Not on file  Social History Narrative  . Not on file   Social Determinants of Health   Financial Resource Strain:   . Difficulty of Paying Living Expenses: Not on file  Food Insecurity:   . Worried About Programme researcher, broadcasting/film/video in the Last Year: Not on file  . Ran Out of Food in the Last Year: Not on file  Transportation Needs:   . Lack of Transportation (Medical): Not on file  . Lack of Transportation (Non-Medical): Not on file  Physical Activity:   . Days of Exercise per Week: Not on file  . Minutes of Exercise per Session: Not on file  Stress:   . Feeling of Stress : Not on file  Social Connections:   . Frequency of Communication with Friends and Family: Not on file  . Frequency of Social Gatherings with Friends and Family: Not on file  . Attends Religious Services: Not on file  . Active Member of Clubs or Organizations: Not on file  . Attends Banker Meetings: Not on file  .  Marital Status: Not on file  Intimate Partner Violence:   . Fear of Current or Ex-Partner: Not on file  . Emotionally Abused: Not on file  . Physically Abused: Not on file  . Sexually Abused: Not on file   Family History  Problem Relation Age of Onset  . Healthy Mother   . Healthy Father     OBJECTIVE:  Vitals:   01/26/20 0843  BP: 116/77  Pulse: 85  Resp: 15  Temp: 98.2 F (36.8 C)  SpO2: 99%     Physical Exam Vitals and nursing note reviewed.  Constitutional:      General: She is not in acute distress.    Appearance: Normal appearance. She is normal weight. She is not ill-appearing, toxic-appearing or diaphoretic.  HENT:     Head: Normocephalic.  Cardiovascular:     Rate and Rhythm: Normal rate and regular rhythm.     Pulses: Normal pulses.      Heart sounds: Normal heart sounds. No murmur heard.  No friction rub. No gallop.   Pulmonary:     Effort: Pulmonary effort is normal. No respiratory distress.     Breath sounds: Normal breath sounds. No stridor. No wheezing, rhonchi or rales.  Chest:     Chest wall: No tenderness.  Musculoskeletal:        General: Tenderness present.     Lumbar back: Spasms and tenderness present.     Comments: Back:  Patient ambulates from chair to exam table without difficulty.  Inspection: Skin clear and intact without obvious swelling, erythema, or ecchymosis. Warm to the touch  Palpation: Vertebral processes nontender. Tenderness about the lower  paravertebral muscles  ROM: FROM Strength: 5/5 hip flexion, 5/5 knee extension, 5/5 knee flexion, 5/5 plantar flexion, 5/5 dorsiflexion  DTR: Patellar tendon reflex intact    Neurological:     Mental Status: She is alert and oriented to person, place, and time.     LABS:  No results found for this or any previous visit (from the past 24 hour(s)).   ASSESSMENT & PLAN:  1. Chronic midline low back pain with bilateral sciatica     Meds ordered this encounter  Medications  . dexamethasone (DECADRON) injection 10 mg  . predniSONE (STERAPRED UNI-PAK 21 TAB) 10 MG (21) TBPK tablet    Sig: Take 6 tabs by mouth daily  for 1 days, then 5 tabs for 1 days, then 4 tabs for 1 days, then 3 tabs for 1ays, 2 tabs for 1 days, then 1 tab by mouth daily for 1days    Dispense:  21 tablet    Refill:  0    Discharge instructions  Rest, ice and heat as needed Ensure adequate ROM as tolerated. Prescribed prednisone for inflammation  Continue to take Robaxin as prescribed for muscle spasm.   Return here or go to ER if you have any new or worsening symptoms such as numbness/tingling of the inner thighs, loss of bladder or bowel control, headache/blurry vision, nausea/vomiting, confusion/altered mental status, dizziness, weakness, passing out, imbalance, etc...    Reviewed expectations re: course of current medical issues. Questions answered. Outlined signs and symptoms indicating need for more acute intervention. Patient verbalized understanding. After Visit Summary given.         Durward Parcel, FNP 01/26/20 [phone]

## 2020-01-26 NOTE — ED Triage Notes (Signed)
Patient states that she was seen in ER for bilateral leg pain that comes from her lower back. having more pain and discomfort- would like a steroid.

## 2020-02-19 DIAGNOSIS — U071 COVID-19: Secondary | ICD-10-CM | POA: Diagnosis not present

## 2020-02-19 DIAGNOSIS — Z20828 Contact with and (suspected) exposure to other viral communicable diseases: Secondary | ICD-10-CM | POA: Diagnosis not present

## 2020-02-26 DIAGNOSIS — U071 COVID-19: Secondary | ICD-10-CM | POA: Diagnosis not present

## 2020-02-26 DIAGNOSIS — Z20828 Contact with and (suspected) exposure to other viral communicable diseases: Secondary | ICD-10-CM | POA: Diagnosis not present

## 2020-03-02 DIAGNOSIS — U071 COVID-19: Secondary | ICD-10-CM | POA: Diagnosis not present

## 2020-03-02 DIAGNOSIS — Z20828 Contact with and (suspected) exposure to other viral communicable diseases: Secondary | ICD-10-CM | POA: Diagnosis not present

## 2020-03-11 DIAGNOSIS — Z20828 Contact with and (suspected) exposure to other viral communicable diseases: Secondary | ICD-10-CM | POA: Diagnosis not present

## 2020-03-11 DIAGNOSIS — U071 COVID-19: Secondary | ICD-10-CM | POA: Diagnosis not present

## 2020-03-26 DIAGNOSIS — U071 COVID-19: Secondary | ICD-10-CM | POA: Diagnosis not present

## 2020-03-26 DIAGNOSIS — Z20828 Contact with and (suspected) exposure to other viral communicable diseases: Secondary | ICD-10-CM | POA: Diagnosis not present

## 2020-03-30 DIAGNOSIS — Z20828 Contact with and (suspected) exposure to other viral communicable diseases: Secondary | ICD-10-CM | POA: Diagnosis not present

## 2020-03-30 DIAGNOSIS — U071 COVID-19: Secondary | ICD-10-CM | POA: Diagnosis not present

## 2020-04-01 DIAGNOSIS — Z20828 Contact with and (suspected) exposure to other viral communicable diseases: Secondary | ICD-10-CM | POA: Diagnosis not present

## 2020-04-01 DIAGNOSIS — U071 COVID-19: Secondary | ICD-10-CM | POA: Diagnosis not present

## 2020-04-23 ENCOUNTER — Other Ambulatory Visit: Payer: Self-pay

## 2020-04-23 ENCOUNTER — Encounter: Payer: Self-pay | Admitting: Emergency Medicine

## 2020-04-23 ENCOUNTER — Ambulatory Visit
Admission: EM | Admit: 2020-04-23 | Discharge: 2020-04-23 | Disposition: A | Discharge status: Home / Self Care | Payer: BC Managed Care – PPO | Attending: Emergency Medicine | Admitting: Emergency Medicine

## 2020-04-23 DIAGNOSIS — R109 Unspecified abdominal pain: Secondary | ICD-10-CM | POA: Diagnosis not present

## 2020-04-23 DIAGNOSIS — N390 Urinary tract infection, site not specified: Secondary | ICD-10-CM | POA: Diagnosis not present

## 2020-04-23 LAB — POCT URINALYSIS DIP (MANUAL ENTRY)
Bilirubin, UA: NEGATIVE
Glucose, UA: NEGATIVE mg/dL
Ketones, POC UA: NEGATIVE mg/dL
Nitrite, UA: NEGATIVE
Protein Ur, POC: 100 mg/dL — AB
Spec Grav, UA: 1.025 (ref 1.010–1.025)
Urobilinogen, UA: 1 E.U./dL
pH, UA: 6 (ref 5.0–8.0)

## 2020-04-23 MED ORDER — NITROFURANTOIN MONOHYD MACRO 100 MG PO CAPS: 100.0000 mg | ORAL_CAPSULE | Freq: Two times a day (BID) | ORAL | 0 refills | Status: DC

## 2020-04-23 NOTE — ED Triage Notes (Signed)
Pain in right flank area.  States it feels like she has to urinate constantly x one week

## 2020-04-23 NOTE — ED Provider Notes (Signed)
[facility]   Chief Complaint  Patient presents with  . Flank Pain     SUBJECTIVE:  Michelle Mcdowell is a 40 y.o. female who presents to the urgent care for complaint of right flank pain for the past 1 week.  Patient denies a precipitating event, recent sexual encounter, excessive caffeine intake.  Localizes the pain to the right flank.  Pain is intermittent and described as aching character.  Has tried OTC medications without relief.  Symptoms are made worse with urination.  Admits to similar symptoms in the past.  Denies fever, chills, nausea, vomiting, abdominal pain, abnormal vaginal discharge or bleeding, hematuria.    LMP: Patient's last menstrual period was 04/06/2020.  ROS: As in HPI.  All other pertinent ROS negative.     Past Medical History:  Diagnosis Date  . Back pain   . Migraine    Past Surgical History:  Procedure Laterality Date  . BACK SURGERY    . COSMETIC SURGERY    . KNEE SURGERY    . TUBAL LIGATION  05/09/2011   Procedure: POST PARTUM TUBAL LIGATION;  Surgeon: Catalina Antigua, MD;  Location: [facility];  Service: Gynecology;  Laterality: N/A;   No Known Allergies No current facility-administered medications on file prior to encounter.   Current Outpatient Medications on File Prior to Encounter  Medication Sig Dispense Refill  . ibuprofen (ADVIL,MOTRIN) 800 MG tablet Take 1 tablet (800 mg total) by mouth 3 (three) times daily. 21 tablet 0  . methocarbamol (ROBAXIN) 500 MG tablet Take 1 tablet (500 mg total) by mouth every 8 (eight) hours as needed for muscle spasms. 15 tablet 0  . predniSONE (STERAPRED UNI-PAK 21 TAB) 10 MG (21) TBPK tablet Take 6 tabs by mouth daily  for 1 days, then 5 tabs for 1 days, then 4 tabs for 1 days, then 3 tabs for 1ays, 2 tabs for 1 days, then 1 tab by mouth daily for 1days 21 tablet 0   Social History   Socioeconomic History  . Marital status: Single    Spouse name: Not on file  . Number of children: Not on file  .  Years of education: Not on file  . Highest education level: Not on file  Occupational History  . Not on file  Tobacco Use  . Smoking status: Former Smoker    Types: Cigarettes  . Smokeless tobacco: Never Used  Vaping Use  . Vaping Use: Never used  Substance and Sexual Activity  . Alcohol use: No  . Drug use: No  . Sexual activity: Yes    Birth control/protection: None, Surgical  Other Topics Concern  . Not on file  Social History Narrative  . Not on file   Social Determinants of Health   Financial Resource Strain: Not on file  Food Insecurity: Not on file  Transportation Needs: Not on file  Physical Activity: Not on file  Stress: Not on file  Social Connections: Not on file  Intimate Partner Violence: Not on file   Family History  Problem Relation Age of Onset  . Healthy Mother   . Healthy Father     OBJECTIVE:  Vitals:   04/23/20 1415  BP: 135/88  Pulse: 90  Resp: 18  Temp: 97.9 F (36.6 C)  TempSrc: Oral  SpO2: 100%  Weight: 220 lb (99.8 kg)  Height: 5\' 2"  (1.575 m)   General appearance: AOx3 in no acute distress HEENT: NCAT.  Oropharynx clear.  Lungs: clear to auscultation bilaterally  without adventitious breath sounds Heart: regular rate and rhythm.  Radial pulses 2+ symmetrical bilaterally Abdomen: soft; non-distended; no tenderness; bowel sounds present; no guarding or rebound tenderness Back: no CVA tenderness Extremities: no edema; symmetrical with no gross deformities Skin: warm and dry Neurologic: Ambulates from chair to exam table without difficulty Psychological: alert and cooperative; normal mood and affect  Labs Reviewed  POCT URINALYSIS DIP (MANUAL ENTRY) - Abnormal; Notable for the following components:      Result Value   Clarity, UA cloudy (*)    Blood, UA large (*)    Protein Ur, POC =100 (*)    Leukocytes, UA Large (3+) (*)    All other components within normal limits  URINE CULTURE    ASSESSMENT & PLAN:  1. Right flank  pain   2. Acute UTI     Meds ordered this encounter  Medications  . nitrofurantoin, macrocrystal-monohydrate, (MACROBID) 100 MG capsule    Sig: Take 1 capsule (100 mg total) by mouth 2 (two) times daily.    Dispense:  10 capsule    Refill:  0   Discharge Instructions  Urine culture sent.  We will call you with the results.   Push fluids and get plenty of rest.   Take antibiotic as directed and to completion Follow up with PCP if symptoms persists Return here or go to ER if you have any new or worsening symptoms such as fever, worsening abdominal pain, nausea/vomiting, flank pain, etc...  Outlined signs and symptoms indicating need for more acute intervention. Patient verbalized understanding. After Visit Summary given.     Durward Parcel, FNP 04/23/20 1448

## 2020-04-23 NOTE — Discharge Instructions (Addendum)
Urine culture sent.  We will call you with the results.   Push fluids and get plenty of rest.   Take antibiotic as directed and to completion Follow up with PCP if symptoms persists Return here or go to ER if you have any new or worsening symptoms such as fever, worsening abdominal pain, nausea/vomiting, flank pain, etc... 

## 2020-04-25 LAB — URINE CULTURE

## 2020-04-26 LAB — URINE CULTURE: Culture: 80000 — AB

## 2020-06-10 DIAGNOSIS — U071 COVID-19: Secondary | ICD-10-CM | POA: Diagnosis not present

## 2020-06-10 DIAGNOSIS — Z20828 Contact with and (suspected) exposure to other viral communicable diseases: Secondary | ICD-10-CM | POA: Diagnosis not present

## 2020-06-22 DIAGNOSIS — Z20828 Contact with and (suspected) exposure to other viral communicable diseases: Secondary | ICD-10-CM | POA: Diagnosis not present

## 2020-06-22 DIAGNOSIS — U071 COVID-19: Secondary | ICD-10-CM | POA: Diagnosis not present

## 2020-08-12 ENCOUNTER — Other Ambulatory Visit: Payer: Self-pay

## 2020-08-12 ENCOUNTER — Emergency Department (HOSPITAL_COMMUNITY)
Admission: EM | Admit: 2020-08-12 | Discharge: 2020-08-13 | Disposition: A | Discharge status: Home / Self Care | Payer: BC Managed Care – PPO | Attending: Emergency Medicine | Admitting: Emergency Medicine

## 2020-08-12 DIAGNOSIS — M79605 Pain in left leg: Secondary | ICD-10-CM | POA: Insufficient documentation

## 2020-08-12 DIAGNOSIS — Z5321 Procedure and treatment not carried out due to patient leaving prior to being seen by health care provider: Secondary | ICD-10-CM | POA: Insufficient documentation

## 2020-08-12 NOTE — ED Triage Notes (Signed)
Pt states she stepped off her truck when she felt like her left calf "ripped."

## 2021-01-24 ENCOUNTER — Other Ambulatory Visit: Payer: Self-pay

## 2021-01-24 ENCOUNTER — Emergency Department (HOSPITAL_COMMUNITY)
Admission: EM | Admit: 2021-01-24 | Discharge: 2021-01-24 | Disposition: A | Discharge status: Home / Self Care | Payer: BC Managed Care – PPO | Attending: Emergency Medicine | Admitting: Emergency Medicine

## 2021-01-24 ENCOUNTER — Encounter (HOSPITAL_COMMUNITY): Payer: Self-pay

## 2021-01-24 ENCOUNTER — Emergency Department (HOSPITAL_COMMUNITY): Payer: BC Managed Care – PPO

## 2021-01-24 DIAGNOSIS — N939 Abnormal uterine and vaginal bleeding, unspecified: Secondary | ICD-10-CM | POA: Diagnosis not present

## 2021-01-24 DIAGNOSIS — Z87891 Personal history of nicotine dependence: Secondary | ICD-10-CM | POA: Diagnosis not present

## 2021-01-24 DIAGNOSIS — R102 Pelvic and perineal pain: Secondary | ICD-10-CM | POA: Diagnosis not present

## 2021-01-24 DIAGNOSIS — R519 Headache, unspecified: Secondary | ICD-10-CM | POA: Diagnosis not present

## 2021-01-24 LAB — COMPREHENSIVE METABOLIC PANEL
ALT: 18 U/L (ref 0–44)
AST: 13 U/L — ABNORMAL LOW (ref 15–41)
Albumin: 3.7 g/dL (ref 3.5–5.0)
Alkaline Phosphatase: 49 U/L (ref 38–126)
Anion gap: 3 — ABNORMAL LOW (ref 5–15)
BUN: 10 mg/dL (ref 6–20)
CO2: 28 mmol/L (ref 22–32)
Calcium: 8.6 mg/dL — ABNORMAL LOW (ref 8.9–10.3)
Chloride: 105 mmol/L (ref 98–111)
Creatinine, Ser: 0.63 mg/dL (ref 0.44–1.00)
GFR, Estimated: >60 mL/min (ref >60)
Glucose, Bld: 93 mg/dL (ref 70–99)
Potassium: 3.7 mmol/L (ref 3.5–5.1)
Sodium: 136 mmol/L (ref 135–145)
Total Bilirubin: 0.3 mg/dL (ref 0.3–1.2)
Total Protein: 6.9 g/dL (ref 6.5–8.1)

## 2021-01-24 LAB — CBC WITH DIFFERENTIAL/PLATELET
Abs Immature Granulocytes: 0.01 10*3/uL (ref 0.00–0.07)
Basophils Absolute: 0 10*3/uL (ref 0.0–0.1)
Basophils Relative: 1 %
Eosinophils Absolute: 0.1 10*3/uL (ref 0.0–0.5)
Eosinophils Relative: 2 %
HCT: 36 % (ref 36.0–46.0)
Hemoglobin: 11.7 g/dL — ABNORMAL LOW (ref 12.0–15.0)
Immature Granulocytes: 0 %
Lymphocytes Relative: 33 %
Lymphs Abs: 1.4 10*3/uL (ref 0.7–4.0)
MCH: 29.7 pg (ref 26.0–34.0)
MCHC: 32.5 g/dL (ref 30.0–36.0)
MCV: 91.4 fL (ref 80.0–100.0)
Monocytes Absolute: 0.3 10*3/uL (ref 0.1–1.0)
Monocytes Relative: 7 %
Neutro Abs: 2.3 10*3/uL (ref 1.7–7.7)
Neutrophils Relative %: 57 %
Platelets: 311 10*3/uL (ref 150–400)
RBC: 3.94 MIL/uL (ref 3.87–5.11)
RDW: 11.9 % (ref 11.5–15.5)
WBC: 4.1 10*3/uL (ref 4.0–10.5)
nRBC: 0 % (ref 0.0–0.2)

## 2021-01-24 LAB — URINALYSIS, ROUTINE W REFLEX MICROSCOPIC
Bacteria, UA: NONE SEEN
Bilirubin Urine: NEGATIVE
Glucose, UA: NEGATIVE mg/dL
Ketones, ur: NEGATIVE mg/dL
Leukocytes,Ua: NEGATIVE
Nitrite: NEGATIVE
Protein, ur: NEGATIVE mg/dL
Specific Gravity, Urine: 1.019 (ref 1.005–1.030)
pH: 6 (ref 5.0–8.0)

## 2021-01-24 LAB — TYPE AND SCREEN
ABO/RH(D): A POS
Antibody Screen: NEGATIVE

## 2021-01-24 LAB — PROTIME-INR
INR: 1 (ref 0.8–1.2)
Prothrombin Time: 12.9 seconds (ref 11.4–15.2)

## 2021-01-24 LAB — HCG, SERUM, QUALITATIVE: Preg, Serum: NEGATIVE

## 2021-01-24 MED ORDER — NAPROXEN SODIUM 220 MG PO TABS: 220.0000 mg | ORAL_TABLET | Freq: Two times a day (BID) | ORAL | 0 refills | Status: AC

## 2021-01-24 MED ORDER — DIPHENHYDRAMINE HCL 50 MG/ML IJ SOLN
25.0000 mg | Freq: Once | INTRAMUSCULAR | Status: AC
  Administered 2021-01-24: 25 mg via INTRAVENOUS
  Filled 2021-01-24: qty 1

## 2021-01-24 MED ORDER — METOCLOPRAMIDE HCL 5 MG/ML IJ SOLN
5.0000 mg | Freq: Once | INTRAMUSCULAR | Status: AC
  Administered 2021-01-24: 5 mg via INTRAVENOUS
  Filled 2021-01-24: qty 2

## 2021-01-24 MED ORDER — SODIUM CHLORIDE 0.9 % IV BOLUS
1000.0000 mL | Freq: Once | INTRAVENOUS | Status: AC
  Administered 2021-01-24: 1000 mL via INTRAVENOUS

## 2021-01-24 NOTE — ED Provider Notes (Signed)
[facility] EMERGENCY DEPARTMENT Provider Note   CSN: [id number] Arrival date & time: 01/24/21  3419     History Chief Complaint  Patient presents with   Vaginal Bleeding    Michelle Mcdowell is a 40 y.o. female.  This is a 40 y.o. female with significant medical history as below, including G3P3, migraines who presents to the ED with complaint of vaginal bleeding x5 days, headache. Pelvic cramping.   Location:  lower pelvic cramping Duration:  5 days Onset:  gradual Timing:  intermittent Description:  cramping, period like cramps Severity:  mild Exacerbating/Alleviating Factors:  none reported Associated Symptoms:  headache typical of prior migraine without neurologic abnormalities  Pertinent Negatives:  no fevers chills, chest pain, n/v/d, change to urination or bowel function, no concern for STI, no pain with sexual activity. Bleeding mildly worse this morning, 1 pad every 30-45 minutes. Has not changed pad while in the ED. Ongoing bleeding. No coagulapathy, no hx fibroids, no concern for preg. LMP last month, typically starts around the end of the month, typically regular periods. Took excedrin x2 this AM which typically resolves headache, did not resolve HA this am.  Last dose 2 hrs ago x2 tablets.    The history is provided by the patient. No language interpreter was used.  Vaginal Bleeding Quality:  Heavier than menses Severity:  Mild Onset quality:  Gradual Duration:  5 days Timing:  Intermittent Progression:  Waxing and waning Chronicity:  New Associated symptoms: no abdominal pain, no back pain, no dysuria and no fever       Past Medical History:  Diagnosis Date   Back pain    Migraine     Patient Active Problem List   Diagnosis Date Noted   Supervision of normal pregnancy 05/08/2011   Oligohydramnios 05/08/2011   History of fetal biophysical profile with non-stress test 05/08/2011    Past Surgical History:  Procedure Laterality Date   BACK SURGERY      COSMETIC SURGERY     KNEE SURGERY     TUBAL LIGATION  05/09/2011   Procedure: POST PARTUM TUBAL LIGATION;  Surgeon: Catalina Antigua, MD;  Location: [facility];  Service: Gynecology;  Laterality: N/A;     OB History     Gravida  6   Para  3   Term  3   Preterm      AB  3   Living  3      SAB  3   IAB      Ectopic      Multiple      Live Births  3           Family History  Problem Relation Age of Onset   Healthy Mother    Healthy Father     Social History   Tobacco Use   Smoking status: Former    Types: Cigarettes   Smokeless tobacco: Never  Vaping Use   Vaping Use: Never used  Substance Use Topics   Alcohol use: No   Drug use: No    Home Medications Prior to Admission medications   Medication Sig Start Date End Date Taking? Authorizing Provider  naproxen sodium (ALEVE) 220 MG tablet Take 1 tablet (220 mg total) by mouth 2 (two) times daily with a meal for 7 days. 01/24/21 01/31/21 Yes Sloan Leiter, DO  methocarbamol (ROBAXIN) 500 MG tablet Take 1 tablet (500 mg total) by mouth every 8 (eight) hours as needed for muscle spasms. 01/25/20  Pricilla Loveless, MD  nitrofurantoin, macrocrystal-monohydrate, (MACROBID) 100 MG capsule Take 1 capsule (100 mg total) by mouth 2 (two) times daily. 04/23/20   Avegno, Zachery Dakins, FNP  predniSONE (STERAPRED UNI-PAK 21 TAB) 10 MG (21) TBPK tablet Take 6 tabs by mouth daily  for 1 days, then 5 tabs for 1 days, then 4 tabs for 1 days, then 3 tabs for 1ays, 2 tabs for 1 days, then 1 tab by mouth daily for 1days 01/26/20   Durward Parcel, FNP    Allergies    Patient has no known allergies.  Review of Systems   Review of Systems  Constitutional:  Negative for chills and fever.  HENT:  Negative for ear pain and sore throat.   Eyes:  Negative for pain and visual disturbance.  Respiratory:  Negative for cough and shortness of breath.   Cardiovascular:  Negative for chest pain and palpitations.  Gastrointestinal:   Negative for abdominal pain and vomiting.  Genitourinary:  Positive for pelvic pain and vaginal bleeding. Negative for dysuria and hematuria.  Musculoskeletal:  Negative for arthralgias and back pain.  Skin:  Negative for color change and rash.  Neurological:  Positive for headaches. Negative for seizures and syncope.  All other systems reviewed and are negative.  Physical Exam Updated Vital Signs BP (!) 149/91 (BP Location: Right Arm)   Pulse 74   Temp 98 F (36.7 C) (Oral)   Resp 16   Ht 5\' 2"  (1.575 m)   Wt 112.9 kg   LMP 01/20/2021   SpO2 100%   BMI 45.54 kg/m   Physical Exam Vitals and nursing note reviewed. Exam conducted with a chaperone present.  Constitutional:      General: She is not in acute distress.    Appearance: Normal appearance. She is obese.  HENT:     Head: Normocephalic and atraumatic.     Right Ear: External ear normal.     Left Ear: External ear normal.     Nose: Nose normal.     Mouth/Throat:     Mouth: Mucous membranes are moist.  Eyes:     General: No scleral icterus.       Right eye: No discharge.        Left eye: No discharge.  Cardiovascular:     Rate and Rhythm: Normal rate and regular rhythm.     Pulses: Normal pulses.     Heart sounds: Normal heart sounds.  Pulmonary:     Effort: Pulmonary effort is normal. No respiratory distress.     Breath sounds: Normal breath sounds.  Abdominal:     General: Abdomen is flat.     Tenderness: There is no abdominal tenderness.  Genitourinary:    Vagina: No foreign body.     Cervix: No cervical motion tenderness.     Comments: Scant dark red blood in vaginal vault, no lesions or tears, no frank bleeding. No CMT.  Musculoskeletal:        General: Normal range of motion.     Cervical back: Normal range of motion.     Right lower leg: No edema.     Left lower leg: No edema.  Skin:    General: Skin is warm and dry.     Capillary Refill: Capillary refill takes less than 2 seconds.  Neurological:      General: No focal deficit present.     Mental Status: She is alert and oriented to person, place, and time.  GCS: GCS eye subscore is 4. GCS verbal subscore is 5. GCS motor subscore is 6.  Psychiatric:        Mood and Affect: Mood normal.        Behavior: Behavior normal.    ED Results / Procedures / Treatments   Labs (all labs ordered are listed, but only abnormal results are displayed) Labs Reviewed  URINALYSIS, ROUTINE W REFLEX MICROSCOPIC - Abnormal; Notable for the following components:      Result Value   Hgb urine dipstick MODERATE (*)    All other components within normal limits  CBC WITH DIFFERENTIAL/PLATELET - Abnormal; Notable for the following components:   Hemoglobin 11.7 (*)    All other components within normal limits  COMPREHENSIVE METABOLIC PANEL - Abnormal; Notable for the following components:   Calcium 8.6 (*)    AST 13 (*)    Anion gap 3 (*)    All other components within normal limits  PROTIME-INR  HCG, SERUM, QUALITATIVE  TYPE AND SCREEN    EKG None  Radiology US Pelvis Complete  Result Date: 01/24/2021 CLINICAL DATA:  Abnormal uterine bleeding. EXAM: TRANSABDOMINAL ULTRASOUND OF PELVIS TECHNIQUE: Transabdominal ultrasound examination of the pelvis was performed including evaluation of the uterus, ovaries, adnexal regions, and pelvic cul-de-sac. COMPARISON:  None. FINDINGS: Uterus Measurements: Retroflex uterus measures 9.7 x 5.2 x 6.4 cm = volume: 170 mL. No fibroids or other mass visualized. Endometrium Thickness: 4.0 mm.  Normal thickness. Right ovary Measurements: Not identified.  Bowel gas and large body habitus. Left ovary Measurements: 3.1 x 2.3 x 2.0 cm = volume: 7.2 mL. Normal appearance/no adnexal mass. Other findings:  No abnormal free fluid. IMPRESSION: 1. Normal retroflexed uterus. 2. Normal endometrium. 3. Normal LEFT ovary. 4. RIGHT ovary not identified as above. Electronically Signed   By: Genevive Bi M.D.   On: 01/24/2021 09:44     Procedures Procedures   Medications Ordered in ED Medications  sodium chloride 0.9 % bolus 1,000 mL (0 mLs Intravenous Stopped 01/24/21 0947)  metoCLOPramide (REGLAN) injection 5 mg (5 mg Intravenous Given 01/24/21 0750)  diphenhydrAMINE (BENADRYL) injection 25 mg (25 mg Intravenous Given 01/24/21 0749)    ED Course  I have reviewed the triage vital signs and the nursing notes.  Pertinent labs & imaging results that were available during my care of the patient were reviewed by me and considered in my medical decision making (see chart for details).    MDM Rules/Calculators/A&P                          This patient complains of vaginal bleeding/cramping; this involves an extensive number of treatment options and is a complaint that carries with it a high risk of complications and morbidity. Vital signs reviewed and are stable. Serious etiologies considered.   GU exam completed with chaperone Personnel officer. Scant vaginal bleeding. Dark red. No external lesions, no CMT.    I ordered, reviewed and interpreted labs,   I ordered medication reglan, benadryl.  I ordered imaging studies which included pelvic US and I independently visualized and interpreted imaging which showed no acute process   Previous records obtained and reviewed    Pt with AUB, preg negative, GU exam stable. Advised aleve daily and f/u with OBGYN for ongoing care, pt agreeable. No further vaginal bleeding while in ED.  HA resolved. HA was consistent with her prior migraines.  The patient improved significantly and was discharged in stable condition.  Detailed discussions were had with the patient regarding current findings, and need for close f/u with PCP or on call doctor. The patient has been instructed to return immediately if the symptoms worsen in any way for re-evaluation. Patient verbalized understanding and is in agreement with current care plan. All questions answered prior to discharge.     Final  Clinical Impression(s) / ED Diagnoses Final diagnoses:  Abnormal uterine bleeding (AUB)    Rx / DC Orders ED Discharge Orders          Ordered    naproxen sodium (ALEVE) 220 MG tablet  2 times daily with meals        01/24/21 0830             Sloan Leiter, DO 01/24/21 1826

## 2021-01-24 NOTE — ED Triage Notes (Signed)
Pt presents to ED with complaints of headache and heavy vaginal bleeding started this am.

## 2021-01-24 NOTE — Discharge Instructions (Addendum)
Please follow-up with your OB/GYN for further evaluation

## 2021-06-21 ENCOUNTER — Ambulatory Visit: Payer: BC Managed Care – PPO | Admitting: Adult Health

## 2021-06-21 ENCOUNTER — Other Ambulatory Visit (HOSPITAL_COMMUNITY)
Admission: RE | Admit: 2021-06-21 | Discharge: 2021-06-21 | Disposition: A | Discharge status: Home / Self Care | Payer: BC Managed Care – PPO | Source: Ambulatory Visit | Attending: Adult Health | Admitting: Adult Health

## 2021-06-21 ENCOUNTER — Encounter: Payer: Self-pay | Admitting: Adult Health

## 2021-06-21 ENCOUNTER — Other Ambulatory Visit: Payer: Self-pay

## 2021-06-21 VITALS — BP 142/100 | HR 88 | Ht 66.0 in | Wt 256.0 lb

## 2021-06-21 DIAGNOSIS — N939 Abnormal uterine and vaginal bleeding, unspecified: Secondary | ICD-10-CM

## 2021-06-21 DIAGNOSIS — N926 Irregular menstruation, unspecified: Secondary | ICD-10-CM | POA: Diagnosis not present

## 2021-06-21 DIAGNOSIS — Z124 Encounter for screening for malignant neoplasm of cervix: Secondary | ICD-10-CM | POA: Insufficient documentation

## 2021-06-21 DIAGNOSIS — R5383 Other fatigue: Secondary | ICD-10-CM | POA: Diagnosis not present

## 2021-06-21 DIAGNOSIS — Z3202 Encounter for pregnancy test, result negative: Secondary | ICD-10-CM | POA: Diagnosis not present

## 2021-06-21 DIAGNOSIS — R030 Elevated blood-pressure reading, without diagnosis of hypertension: Secondary | ICD-10-CM | POA: Insufficient documentation

## 2021-06-21 LAB — POCT URINE PREGNANCY: Preg Test, Ur: NEGATIVE

## 2021-06-21 MED ORDER — LISINOPRIL-HYDROCHLOROTHIAZIDE 10-12.5 MG PO TABS: 1.0000 | ORAL_TABLET | Freq: Every day | ORAL | 3 refills | Status: DC

## 2021-06-21 MED ORDER — MEGESTROL ACETATE 40 MG PO TABS: ORAL_TABLET | ORAL | 0 refills | Status: DC

## 2021-06-21 NOTE — Progress Notes (Signed)
?Subjective:  ?  ? Patient ID: Michelle Mcdowell, female   DOB: 1980-08-03, 41 y.o.   MRN: [id number] ? ?HPI ?Michelle Mcdowell is a 41 year old white female, with SO, [id number], in complaining of heavy bleeding since 06/16/21, skipped period in January and February was regular before that. Has had cramping and feels tired and light headed at times. Stomach burns, has pain with sex at times and more headaches. ? ? ?Review of Systems ?Denies hot flashes, night sweats or problems with urination or BMS ?See HPI for positives.  ?Reviewed past medical,surgical, social and family history. Reviewed medications and allergies.  ?   ?Objective:  ? Physical Exam ?BP (!) 142/100 (BP Location: Left Arm, Patient Position: Sitting, Cuff Size: Normal)   Pulse 88   Ht 5\' 6"  (1.676 m)   Wt 256 lb (116.1 kg)   LMP 06/16/2021   BMI 41.32 kg/m?   ?  UPT is negative. ?Skin warm and dry. Lungs: clear to ausculation bilaterally. Cardiovascular: regular rate and rhythm.  ?Pelvic: external genitalia is normal in appearance no lesions, vagina: dark period blood, without odor,urethra has no lesions or masses noted, cervix:smooth and bulbous,Pap with GC/CHL, HR HPV genotyping performed, uterus: normal size, shape and contour, mildly tender, no masses felt, adnexa: no masses or tenderness noted. Bladder is non tender and no masses felt.  ?Fall risk is low ? Upstream - 06/21/21 1159   ? ?  ? Pregnancy Intention Screening  ? Does the patient want to become pregnant in the next year? No   ? Does the patient's partner want to become pregnant in the next year? No   ? Would the patient like to discuss contraceptive options today? No   ?  ? Contraception Wrap Up  ? Current Method Female Sterilization   ? End Method Female Sterilization   ? ?  ?  ? ?  ? Examination chaperoned by Levy Pupa LPN ? ?Assessment:  ?   ?1. Pregnancy examination or test, negative result ? ?- POCT urine pregnancy ? ?2. Abnormal uterine bleeding (AUB) ?Will check labs today ?Will get [place]  06/24/21 at 2:30 pm at [facility] to assess uterus and ovaries  ?Will rx megace to stop bleeding  ?- CBC ?- TSH ?- US PELVIC COMPLETE WITH TRANSVAGINAL; Future ? ?3. Irregular periods ? ?- CBC ?- Comprehensive metabolic panel ?- TSH ?- US PELVIC COMPLETE WITH TRANSVAGINAL; Future ? ?4. Tired ?- CBC ?- Comprehensive metabolic panel ?- TSH ? ?5. Elevated BP without diagnosis of hypertension ?Will rx Zestoretic for BP, she did says had taken these meds before, years ago  ?Meds ordered this encounter  ?Medications  ? lisinopril-hydrochlorothiazide (ZESTORETIC) 10-12.5 MG tablet  ?  Sig: Take 1 tablet by mouth daily.  ?  Dispense:  30 tablet  ?  Refill:  3  ?  Order Specific Question:   Supervising Provider  ?  Answer:   Tania Ade H [[id number]]  ? megestrol (MEGACE) 40 MG tablet  ?  Sig: Take 3 x 5 days then 2 x 5 days then 1 daily  ?  Dispense:  45 tablet  ?  Refill:  0  ?  Order Specific Question:   Supervising Provider  ?  Answer:   Tania Ade H [[id number]]  ?  ?- Comprehensive metabolic panel ? ?6. Routine cervical smear ?Pap sent ?Pap in 3 years if normal  ?- Cytology - PAP( )  ?   ?Plan:  ?   ?Follow up  with me in week to review [place] and assess bleeding ?   ?

## 2021-06-22 LAB — COMPREHENSIVE METABOLIC PANEL
ALT: 15 IU/L (ref 0–32)
AST: 12 IU/L (ref 0–40)
Albumin/Globulin Ratio: 1.6 (ref 1.2–2.2)
Albumin: 4.3 g/dL (ref 3.8–4.8)
Alkaline Phosphatase: 65 IU/L (ref 44–121)
BUN/Creatinine Ratio: 14 (ref 9–23)
BUN: 11 mg/dL (ref 6–20)
Bilirubin Total: 0.2 mg/dL (ref 0.0–1.2)
CO2: 21 mmol/L (ref 20–29)
Calcium: 9.4 mg/dL (ref 8.7–10.2)
Chloride: 102 mmol/L (ref 96–106)
Creatinine, Ser: 0.77 mg/dL (ref 0.57–1.00)
Globulin, Total: 2.7 g/dL (ref 1.5–4.5)
Glucose: 98 mg/dL (ref 70–99)
Potassium: 4.5 mmol/L (ref 3.5–5.2)
Sodium: 139 mmol/L (ref 134–144)
Total Protein: 7 g/dL (ref 6.0–8.5)
eGFR: 101 mL/min/{1.73_m2} (ref >59)

## 2021-06-22 LAB — CBC
Hematocrit: 34.6 % (ref 34.0–46.6)
Hemoglobin: 11.8 g/dL (ref 11.1–15.9)
MCH: 28.9 pg (ref 26.6–33.0)
MCHC: 34.1 g/dL (ref 31.5–35.7)
MCV: 85 fL (ref 79–97)
Platelets: 397 10*3/uL (ref 150–450)
RBC: 4.09 x10E6/uL (ref 3.77–5.28)
RDW: 12.1 % (ref 11.7–15.4)
WBC: 6.4 10*3/uL (ref 3.4–10.8)

## 2021-06-22 LAB — TSH: TSH: 2.39 u[IU]/mL (ref 0.450–4.500)

## 2021-06-24 ENCOUNTER — Ambulatory Visit (HOSPITAL_COMMUNITY)
Admission: RE | Admit: 2021-06-24 | Discharge: 2021-06-24 | Disposition: A | Discharge status: Home / Self Care | Payer: BC Managed Care – PPO | Source: Ambulatory Visit | Attending: Adult Health | Admitting: Adult Health

## 2021-06-24 ENCOUNTER — Other Ambulatory Visit: Payer: Self-pay

## 2021-06-24 DIAGNOSIS — N939 Abnormal uterine and vaginal bleeding, unspecified: Secondary | ICD-10-CM | POA: Insufficient documentation

## 2021-06-24 DIAGNOSIS — N926 Irregular menstruation, unspecified: Secondary | ICD-10-CM | POA: Insufficient documentation

## 2021-06-24 DIAGNOSIS — N888 Other specified noninflammatory disorders of cervix uteri: Secondary | ICD-10-CM | POA: Diagnosis not present

## 2021-06-24 DIAGNOSIS — N83291 Other ovarian cyst, right side: Secondary | ICD-10-CM | POA: Diagnosis not present

## 2021-06-24 LAB — CYTOLOGY - PAP
Chlamydia: NEGATIVE
Comment: NEGATIVE
Comment: NEGATIVE
Comment: NORMAL
Diagnosis: NEGATIVE
Diagnosis: REACTIVE
High risk HPV: NEGATIVE
Neisseria Gonorrhea: NEGATIVE

## 2021-06-28 ENCOUNTER — Encounter: Payer: Self-pay | Admitting: Adult Health

## 2021-06-28 ENCOUNTER — Other Ambulatory Visit: Payer: Self-pay

## 2021-06-28 ENCOUNTER — Ambulatory Visit: Payer: BC Managed Care – PPO | Admitting: Adult Health

## 2021-06-28 VITALS — BP 129/84 | HR 89 | Ht 66.0 in | Wt 255.0 lb

## 2021-06-28 DIAGNOSIS — N926 Irregular menstruation, unspecified: Secondary | ICD-10-CM

## 2021-06-28 DIAGNOSIS — N939 Abnormal uterine and vaginal bleeding, unspecified: Secondary | ICD-10-CM

## 2021-06-28 DIAGNOSIS — N83201 Unspecified ovarian cyst, right side: Secondary | ICD-10-CM | POA: Diagnosis not present

## 2021-06-28 DIAGNOSIS — I10 Essential (primary) hypertension: Secondary | ICD-10-CM | POA: Insufficient documentation

## 2021-06-28 NOTE — Progress Notes (Signed)
?  Subjective:  ?  ? Patient ID: Michelle Mcdowell, female   DOB: 12-19-1980, 41 y.o.   MRN: [id number] ? ?HPI ?Michelle Mcdowell is a 41 year old white female, back in follow up on taking megace for bleeding and bleeding stopped, and is here to review [place] and labs. She had weakness in arms and legs and stopped megace after about 3 days.  ? ?Lab Results  ?Component Value Date  ? DIAGPAP  06/21/2021  ?  - Negative for Intraepithelial Lesions or Malignancy (NILM)  ? DIAGPAP - Benign reactive/reparative changes 06/21/2021  ? [place] Negative 06/21/2021  ?  ?Review of Systems ?Bleeding stopped ?Stopped megace had arm and leg weakness ?Reviewed past medical,surgical, social and family history. Reviewed medications and allergies.  ?   ?Objective:  ? Physical Exam ?BP 129/84 (BP Location: Right Arm, Patient Position: Sitting, Cuff Size: Normal)   Pulse 89   Ht 5\' 6"  (1.676 m)   Wt 255 lb (115.7 kg)   LMP 06/16/2021   BMI 41.16 kg/m?   ?  Skin warm and dry. Lungs: clear to ausculation bilaterally. Cardiovascular: regular rate and rhythm.  ? Upstream - 06/28/21 0920   ? ?  ? Pregnancy Intention Screening  ? Does the patient want to become pregnant in the next year? No   ? Does the patient's partner want to become pregnant in the next year? No   ? Would the patient like to discuss contraceptive options today? No   ?  ? Contraception Wrap Up  ? Current Method Female Sterilization   ? End Method Female Sterilization   ? Contraception Counseling Provided No   ? ?  ?  ? ?  ? Reviewed labs and [place] and pap with her. ? ?Assessment:  ?   ?1. Abnormal uterine bleeding (AUB), resolved with megace ?Follow up in 4 week to see if bleeding returned, consider endometrial ablation,handout given  ? ?2. Irregular periods ?US showed thickened endometrium, will get follow up US or sonohysterogram   ? ?3. Cyst of right ovary ?Simple appearing cyst right ovary 4.6 cm, no follow up needed  ? ?4. Hypertension, unspecified type ?BP better, will continue  Zestoretic, has refills ? ? ?5. Morbid obesity ([place]) ?Tried phentermine in past and lost 80 lbs then gained it back,and had elevated HR, not interested in sleeve surgery, may pursue injections,did discuss side effects  ?   ?Plan:  ?   ?Follow up in 4 weeks for ROS ?   ?

## 2021-07-29 ENCOUNTER — Ambulatory Visit: Payer: BC Managed Care – PPO | Admitting: Adult Health

## 2021-09-12 ENCOUNTER — Other Ambulatory Visit: Payer: Self-pay | Admitting: Adult Health

## 2021-10-10 ENCOUNTER — Telehealth: Payer: Self-pay | Admitting: Adult Health

## 2021-10-10 NOTE — Telephone Encounter (Signed)
Patient says that her pharmacy has sent in a refill request for megestrol and hasn't heard anything back. She said she is in need of this medicine. Please advise.

## 2021-10-12 ENCOUNTER — Ambulatory Visit: Payer: BC Managed Care – PPO | Admitting: Adult Health

## 2021-11-14 ENCOUNTER — Ambulatory Visit
Admission: RE | Admit: 2021-11-14 | Discharge: 2021-11-14 | Disposition: A | Discharge status: Home / Self Care | Payer: BC Managed Care – PPO | Source: Ambulatory Visit | Attending: Nurse Practitioner | Admitting: Nurse Practitioner

## 2021-11-14 VITALS — BP 122/84 | HR 83 | Temp 98.0°F | Resp 18

## 2021-11-14 DIAGNOSIS — R350 Frequency of micturition: Secondary | ICD-10-CM | POA: Insufficient documentation

## 2021-11-14 DIAGNOSIS — M5441 Lumbago with sciatica, right side: Secondary | ICD-10-CM | POA: Diagnosis not present

## 2021-11-14 LAB — POCT URINALYSIS DIP (MANUAL ENTRY)
Bilirubin, UA: NEGATIVE
Glucose, UA: NEGATIVE mg/dL
Ketones, POC UA: NEGATIVE mg/dL
Nitrite, UA: POSITIVE — AB
Protein Ur, POC: NEGATIVE mg/dL
Spec Grav, UA: 1.02 (ref 1.010–1.025)
Urobilinogen, UA: 0.2 E.U./dL
pH, UA: 6.5 (ref 5.0–8.0)

## 2021-11-14 LAB — POCT URINE PREGNANCY: Preg Test, Ur: NEGATIVE

## 2021-11-14 MED ORDER — KETOROLAC TROMETHAMINE 30 MG/ML IJ SOLN
30.0000 mg | Freq: Once | INTRAMUSCULAR | Status: AC
  Administered 2021-11-14: 30 mg via INTRAMUSCULAR

## 2021-11-14 MED ORDER — METHOCARBAMOL 1000 MG PO TABS
500.0000 mg | ORAL_TABLET | Freq: Two times a day (BID) | ORAL | 0 refills | Status: DC | PRN
Start: 2021-11-14 — End: 2023-11-10

## 2021-11-14 MED ORDER — NITROFURANTOIN MONOHYD MACRO 100 MG PO CAPS: 100.0000 mg | ORAL_CAPSULE | Freq: Two times a day (BID) | ORAL | 0 refills | Status: AC

## 2021-11-14 MED ORDER — IBUPROFEN 800 MG PO TABS: 800.0000 mg | ORAL_TABLET | Freq: Three times a day (TID) | ORAL | 0 refills | Status: DC | PRN

## 2021-11-14 NOTE — ED Provider Notes (Signed)
[facility]    CSN: [id number] Arrival date & time: 11/14/21  1208      History   Chief Complaint Chief Complaint  Patient presents with   Back Pain    Entered by patient   Appointment    1230    HPI Michelle Mcdowell is a 41 y.o. female.   Patient presents for back pain that started suddenly yesterday after getting her suitcase out of her trunk.Denies recent accident, injury, fall, or trauma to the back.  Reports the pain is severe and describes the pain as sharp, shooting, throbbing, and discomfort.  Reports the pain radiates down the right leg to below the knee.  Has used ice/heat, Tylenol, and ibuprofen for the pain with mild relief.  Denies numbness or tingling, saddle anesthesia, bowel/bladder incontinence, fevers, and nausea/vomiting, and dysuria.  She is having some urinary frequency.    Noticed urinary frequency while on vacation for the past week.  Reports she has been drinking a lot of things that she isn't "supposed to".  She denies dysuria, urinary urgency, voiding smaller amounts, new urinary incontinence, malodorous urine, hematuria, abdominal pain, suprapubic pain/pressure.  She is having pain in her right low back as discussed above.  Denies fevers, nausea/vomiting.  Denies vaginal discharge.  Has not taken anything or tried anything for her urinary symptoms so far.    Past Medical History:  Diagnosis Date   Back pain    Migraine     Patient Active Problem List   Diagnosis Date Noted   Morbid obesity (HCC) 06/28/2021   Cyst of right ovary 06/28/2021   Hypertension 06/28/2021   Tired 06/21/2021   Irregular periods 06/21/2021   Abnormal uterine bleeding (AUB) 06/21/2021   Pregnancy examination or test, negative result 06/21/2021   Elevated BP without diagnosis of hypertension 06/21/2021   Supervision of normal pregnancy 05/08/2011   Oligohydramnios 05/08/2011   History of fetal biophysical profile with non-stress test 05/08/2011    Past  Surgical History:  Procedure Laterality Date   BACK SURGERY     COSMETIC SURGERY     KNEE SURGERY     TUBAL LIGATION  05/09/2011   Procedure: POST PARTUM TUBAL LIGATION;  Surgeon: Catalina Antigua, MD;  Location: [facility];  Service: Gynecology;  Laterality: N/A;    OB History     Gravida  6   Para  3   Term  3   Preterm      AB  3   Living  3      SAB  3   IAB      Ectopic      Multiple      Live Births  3            Home Medications    Prior to Admission medications   Medication Sig Start Date End Date Taking? Authorizing Provider  acetaminophen (TYLENOL) 325 MG tablet Take by mouth every 6 (six) hours as needed.   Yes [provider]  ibuprofen (ADVIL) 800 MG tablet Take 1 tablet (800 mg total) by mouth every 8 (eight) hours as needed for moderate pain. Take with food to prevent GI upset 11/14/21  Yes Cathlean Marseilles A, NP  methocarbamol 1000 MG TABS Take 500 mg by mouth every 12 (twelve) hours as needed for muscle spasms. Do not take with alcohol or while driving or operating heavy machinery 11/14/21  Yes Valentino Nose, NP  nitrofurantoin, macrocrystal-monohydrate, (MACROBID) 100 MG capsule Take 1 capsule (  100 mg total) by mouth 2 (two) times daily for 5 days. 11/14/21 11/19/21 Yes Eulogio Bear, NP  lisinopril-hydrochlorothiazide (ZESTORETIC) 10-12.5 MG tablet Take 1 tablet by mouth daily. 06/21/21   Estill Dooms, NP    Family History Family History  Problem Relation Age of Onset   Other Father        MVA   Healthy Mother     Social History Social History   Tobacco Use   Smoking status: Former    Types: Cigarettes   Smokeless tobacco: Never  Vaping Use   Vaping Use: Never used  Substance Use Topics   Alcohol use: No   Drug use: No     Allergies   Patient has no known allergies.   Review of Systems Review of Systems Per HPI  Physical Exam Triage Vital Signs ED Triage Vitals  Enc Vitals Group     BP 11/14/21  1221 122/84     Pulse Rate 11/14/21 1221 83     Resp 11/14/21 1221 18     Temp 11/14/21 1221 98 F (36.7 C)     Temp Source 11/14/21 1221 Oral     SpO2 11/14/21 1221 97 %     Weight --      Height --      Head Circumference --      Peak Flow --      Pain Score 11/14/21 1219 6     Pain Loc --      Pain Edu? --      Excl. in [place]? --    No data found.  Updated Vital Signs BP 122/84 (BP Location: Right Arm)   Pulse 83   Temp 98 F (36.7 C) (Oral)   Resp 18   LMP  (Within Months) Comment: 2 months  SpO2 97%   Visual Acuity Right Eye Distance:   Left Eye Distance:   Bilateral Distance:    Right Eye Near:   Left Eye Near:    Bilateral Near:     Physical Exam Vitals and nursing note reviewed.  Constitutional:      General: She is not in acute distress.    Appearance: Normal appearance. She is obese. She is not toxic-appearing.     Comments: Uncomfortable-appearing  HENT:     Head: Normocephalic and atraumatic.     Right Ear: External ear normal.     Left Ear: External ear normal.     Mouth/Throat:     Mouth: Mucous membranes are moist.     Pharynx: Oropharynx is clear.  Eyes:     General: No scleral icterus.    Extraocular Movements: Extraocular movements intact.  Pulmonary:     Effort: Pulmonary effort is normal. No respiratory distress.     Breath sounds: Normal breath sounds. No wheezing or rales.  Chest:     Chest wall: No tenderness.  Abdominal:     Palpations: Abdomen is soft.     Tenderness: There is no right CVA tenderness.  Musculoskeletal:        General: Normal range of motion.     Cervical back: Normal range of motion.     Lumbar back: Spasms and tenderness present. No swelling or edema. Normal range of motion.       Back:     Comments: Inspection: No obvious swelling,deformity, or redness to low back Palpation: tender to palpation in the area marked; no obvious deformities palpated ROM: Full ROM to back although painful.  Full  ROM bilateral  lower extremities.  Strength: 5/5 bilateral lower extremities Neurovascular: neurovascularly intact in left and right lower extremity   Lymphadenopathy:     Cervical: No cervical adenopathy.  Skin:    General: Skin is warm and dry.     Capillary Refill: Capillary refill takes less than 2 seconds.     Coloration: Skin is not jaundiced or pale.     Findings: No erythema.  Neurological:     Mental Status: She is alert and oriented to person, place, and time.  Psychiatric:        Behavior: Behavior is cooperative.      UC Treatments / Results  Labs (all labs ordered are listed, but only abnormal results are displayed) Labs Reviewed  POCT URINALYSIS DIP (MANUAL ENTRY) - Abnormal; Notable for the following components:      Result Value   Blood, UA trace-lysed (*)    Nitrite, UA Positive (*)    Leukocytes, UA Small (1+) (*)    All other components within normal limits  URINE CULTURE  POCT URINE PREGNANCY    EKG   Radiology No results found.  Procedures Procedures (including critical care time)  Medications Ordered in UC Medications  ketorolac (TORADOL) 30 MG/ML injection 30 mg (30 mg Intramuscular Given 11/14/21 1246)    Initial Impression / Assessment and Plan / UC Course  I have reviewed the triage vital signs and the nursing notes.  Pertinent labs & imaging results that were available during my care of the patient were reviewed by me and considered in my medical decision making (see chart for details).    Patient is a pleasant 41 year old female presenting for back pain and urinary frequency.  No red flags in history or on exam, suspect back pain is muscular.  Toradol 30 mg IM given today in urgent care to help with pain control.  Instructed not to use NSAIDs today, can pick up using NSAID with Tylenol alternating tomorrow.  Also start Robaxin every 12 hours as needed for muscle pain-discussed not to use this with alcohol or while driving or operating heavy machinery.   Urinalysis today is positive for nitrites, will send for urine culture and treat with Macrobid in the meantime.  I do not suspect that the symptoms are related today.  ER precautions discussed.  Seek care if symptoms persist or worsen despite treatment. Final Clinical Impressions(s) / UC Diagnoses   Final diagnoses:  Acute right-sided low back pain with right-sided sciatica  Urinary frequency     Discharge Instructions      - Urinalysis today shows that there is a urine infection; please start on the Macrobid and take it twice daily for 5 days to treat this.  We are sending urine for culture and will let you know in a couple of days if we need to change antibiotic.   -For the back pain, we have given you a shot of Toradol 30 mg today.  Please do not take any more ibuprofen, Aleve, naproxen, or other nonsteroidal anti-inflammatory medication today.  Starting tomorrow, you can resume ibuprofen up to 800 mg every 8 hours as needed for pain. -You can continue to use Tylenol 500 to 1000 mg every 4 hours as needed for pain. -If no improvement of pain, you can use methocarbamol 1000 mg every 12 hours as needed for muscular pain.  This may make you sleepy, so do not take with alcohol while driving or operating heavy machinery. -Seek care if symptoms persist or  worsen despite treatment.    ED Prescriptions     Medication Sig Dispense Auth. Provider   nitrofurantoin, macrocrystal-monohydrate, (MACROBID) 100 MG capsule Take 1 capsule (100 mg total) by mouth 2 (two) times daily for 5 days. 10 capsule Cathlean Marseilles A, NP   ibuprofen (ADVIL) 800 MG tablet Take 1 tablet (800 mg total) by mouth every 8 (eight) hours as needed for moderate pain. Take with food to prevent GI upset 21 tablet Cathlean Marseilles A, NP   methocarbamol 1000 MG TABS Take 500 mg by mouth every 12 (twelve) hours as needed for muscle spasms. Do not take with alcohol or while driving or operating heavy machinery 20 tablet Valentino Nose, NP      PDMP not reviewed this encounter.   Valentino Nose, NP 11/14/21 1331

## 2021-11-14 NOTE — Discharge Instructions (Addendum)
-   Urinalysis today shows that there is a urine infection; please start on the Macrobid and take it twice daily for 5 days to treat this.  We are sending urine for culture and will let you know in a couple of days if we need to change antibiotic.   -For the back pain, we have given you a shot of Toradol 30 mg today.  Please do not take any more ibuprofen, Aleve, naproxen, or other nonsteroidal anti-inflammatory medication today.  Starting tomorrow, you can resume ibuprofen up to 800 mg every 8 hours as needed for pain. -You can continue to use Tylenol 500 to 1000 mg every 4 hours as needed for pain. -If no improvement of pain, you can use methocarbamol 1000 mg every 12 hours as needed for muscular pain.  This may make you sleepy, so do not take with alcohol while driving or operating heavy machinery. -Seek care if symptoms persist or worsen despite treatment.

## 2021-11-14 NOTE — ED Triage Notes (Signed)
Pt reports lower right sided back pain x 1 day; increased urinary frequency x 1 week. Ibuprofen and Tylenol gives some relief.

## 2021-11-17 LAB — URINE CULTURE: Culture: >=100000 — AB

## 2021-11-18 ENCOUNTER — Ambulatory Visit
Admission: EM | Admit: 2021-11-18 | Discharge: 2021-11-18 | Disposition: A | Discharge status: Home / Self Care | Payer: BC Managed Care – PPO | Attending: Urgent Care | Admitting: Urgent Care

## 2021-11-18 ENCOUNTER — Ambulatory Visit: Payer: BC Managed Care – PPO

## 2021-11-18 ENCOUNTER — Encounter: Payer: Self-pay | Admitting: Emergency Medicine

## 2021-11-18 DIAGNOSIS — Z8739 Personal history of other diseases of the musculoskeletal system and connective tissue: Secondary | ICD-10-CM

## 2021-11-18 DIAGNOSIS — M5136 Other intervertebral disc degeneration, lumbar region: Secondary | ICD-10-CM | POA: Diagnosis not present

## 2021-11-18 DIAGNOSIS — I10 Essential (primary) hypertension: Secondary | ICD-10-CM | POA: Diagnosis not present

## 2021-11-18 DIAGNOSIS — M5416 Radiculopathy, lumbar region: Secondary | ICD-10-CM

## 2021-11-18 MED ORDER — PREDNISONE 20 MG PO TABS: 40.0000 mg | ORAL_TABLET | Freq: Every day | ORAL | 0 refills | Status: DC

## 2021-11-18 MED ORDER — DEXAMETHASONE SODIUM PHOSPHATE 10 MG/ML IJ SOLN
10.0000 mg | Freq: Once | INTRAMUSCULAR | Status: AC
  Administered 2021-11-18: 10 mg via INTRAMUSCULAR

## 2021-11-18 MED ORDER — TIZANIDINE HCL 4 MG PO TABS: 4.0000 mg | ORAL_TABLET | Freq: Every day | ORAL | 0 refills | Status: DC

## 2021-11-18 NOTE — ED Triage Notes (Signed)
Patient c/o low back pain radiating down right leg for several days.  Patient does have a chronic history of back problems.  Patient has taken Ibuprofen and Tylenol.

## 2021-11-18 NOTE — ED Provider Notes (Signed)
[facility]   MRN: [id number] DOB: 1980/06/26  Subjective:   Michelle Mcdowell is a 41 y.o. female presenting for 1 week history of persistent recurrent low back pain that radiates into the right leg extending to the foot. No fall, trauma, numbness or tingling, saddle paresthesia, changes to bowel or urinary habits, radicular symptoms. Was last seen 11/14/2021 and underwent a Toradol injection.  Has past medical history of bulging disks of the lumbar region.  She had a remote consultation with the spine specialist that recommended surgery but she did not pursue this.  She was also treated correctly for and urinary tract infection with Macrobid and feels like she has cleared this.  Has been using ibuprofen and methocarbamol.  Has a history of essential hypertension and is compliant with her medications.   No current facility-administered medications for this encounter.  Current Outpatient Medications:    acetaminophen (TYLENOL) 325 MG tablet, Take by mouth every 6 (six) hours as needed., Disp: , Rfl:    ibuprofen (ADVIL) 800 MG tablet, Take 1 tablet (800 mg total) by mouth every 8 (eight) hours as needed for moderate pain. Take with food to prevent GI upset, Disp: 21 tablet, Rfl: 0   lisinopril-hydrochlorothiazide (ZESTORETIC) 10-12.5 MG tablet, Take 1 tablet by mouth daily., Disp: 30 tablet, Rfl: 3   methocarbamol 1000 MG TABS, Take 500 mg by mouth every 12 (twelve) hours as needed for muscle spasms. Do not take with alcohol or while driving or operating heavy machinery, Disp: 20 tablet, Rfl: 0   nitrofurantoin, macrocrystal-monohydrate, (MACROBID) 100 MG capsule, Take 1 capsule (100 mg total) by mouth 2 (two) times daily for 5 days., Disp: 10 capsule, Rfl: 0   No Known Allergies  Past Medical History:  Diagnosis Date   Back pain    Migraine      Past Surgical History:  Procedure Laterality Date   BACK SURGERY     COSMETIC SURGERY     KNEE SURGERY     TUBAL LIGATION   05/09/2011   Procedure: POST PARTUM TUBAL LIGATION;  Surgeon: Catalina Antigua, MD;  Location: [facility];  Service: Gynecology;  Laterality: N/A;    Family History  Problem Relation Age of Onset   Other Father        MVA   Healthy Mother     Social History   Tobacco Use   Smoking status: Former    Types: Cigarettes   Smokeless tobacco: Never  Vaping Use   Vaping Use: Never used  Substance Use Topics   Alcohol use: No   Drug use: No    ROS   Objective:   Vitals: BP 125/82 (BP Location: Right Arm)   Pulse 79   Temp 99.1 F (37.3 C) (Oral)   Resp 18   Ht 5\' 6"  (1.676 m)   Wt 255 lb 1.2 oz (115.7 kg)   LMP  (Within Months)   SpO2 95%   BMI 41.17 kg/m   Physical Exam Constitutional:      General: She is not in acute distress.    Appearance: Normal appearance. She is well-developed. She is not ill-appearing, toxic-appearing or diaphoretic.  HENT:     Head: Normocephalic and atraumatic.     Nose: Nose normal.     Mouth/Throat:     Mouth: Mucous membranes are moist.  Eyes:     General: No scleral icterus.       Right eye: No discharge.        Left  eye: No discharge.     Extraocular Movements: Extraocular movements intact.  Cardiovascular:     Rate and Rhythm: Normal rate.  Pulmonary:     Effort: Pulmonary effort is normal.  Musculoskeletal:     Lumbar back: Tenderness (over area outlined) present. No swelling, edema, deformity, signs of trauma, lacerations, spasms or bony tenderness. Normal range of motion. Positive right straight leg raise test. Negative left straight leg raise test. No scoliosis.       Back:  Skin:    General: Skin is warm and dry.  Neurological:     General: No focal deficit present.     Mental Status: She is alert and oriented to person, place, and time.     Motor: No weakness.     Coordination: Coordination abnormal (favoring her low back while sitting and standing, walking).     Gait: Gait normal.     Deep Tendon Reflexes: Reflexes  normal.  Psychiatric:        Mood and Affect: Mood normal.        Behavior: Behavior normal.        Thought Content: Thought content normal.        Judgment: Judgment normal.      01/29/2012 CT LUMBAR SPINE WITHOUT CONTRAST   Technique:  Multidetector CT imaging of the lumbar spine was  performed without intravenous contrast administration. Multiplanar  CT image reconstructions were also generated.   Comparison: CT abdomen and pelvis 09/13/2008   Findings:  Normal appendix.  No local retroperitoneal abnormalities.  Five non-rib bearing lumbar vertebrae.  Osseous mineralization grossly normal.  No fracture or subluxation.  No spondylolysis.   Minimally bulging discs at L4-L5 and L5-S1.  No focal disc herniation or neural compression at any level.  No significant spinal stenosis or neural foraminal stenosis  identified.  Small Schmorl's node at inferior endplate L5.  No additional bone destruction.  No definite postsurgical changes of the lumbar spine identified.   IMPRESSION:  Minimally bulging discs at L4-L5 and L5-S1.  Otherwise negative exam.    Original Report Authenticated By: Lollie Marrow, M.D.   Assessment and Plan :   PDMP not reviewed this encounter.  1. Lumbar radiculopathy   2. History of back pain   3. Bulging lumbar disc   4. Essential hypertension     IM dexamethasone in clinic at 10mg , followed by an oral prednisone course.  Holding ibuprofen, switch to tizanidine from methocarbamol.  Provided her with information to [facility].  No signs of an acute neuro spinal emergency. Counseled patient on potential for adverse effects with medications prescribed/recommended today, ER and return-to-clinic precautions discussed, patient verbalized understanding.    [place], PA-C 11/18/21 1355

## 2022-08-03 ENCOUNTER — Ambulatory Visit (INDEPENDENT_AMBULATORY_CARE_PROVIDER_SITE_OTHER): Payer: BC Managed Care – PPO

## 2022-08-03 ENCOUNTER — Ambulatory Visit
Admission: EM | Admit: 2022-08-03 | Discharge: 2022-08-03 | Disposition: A | Discharge status: Home / Self Care | Payer: BC Managed Care – PPO | Attending: Family Medicine | Admitting: Family Medicine

## 2022-08-03 ENCOUNTER — Other Ambulatory Visit: Payer: Self-pay

## 2022-08-03 DIAGNOSIS — R0789 Other chest pain: Secondary | ICD-10-CM | POA: Diagnosis not present

## 2022-08-03 DIAGNOSIS — R0602 Shortness of breath: Secondary | ICD-10-CM

## 2022-08-03 NOTE — ED Triage Notes (Signed)
Pt reports she is feeling SOB, tightness in her shoulders, has had an elevated BP of 170/119 x 2 days. Feels like something is sitting on her chest.   Has not been taking her bp meds

## 2022-08-03 NOTE — ED Provider Notes (Signed)
[facility]   [id number] 08/03/22 Arrival Time: 1433  ASSESSMENT & PLAN:  1. SOB (shortness of breath)   2. Feeling of chest tightness    Cannot r/o cardiac etiology or PE here. Ques CHF given her description of symptoms and feeling of SOB when supine. Describes PND. To ED via POV for further evaluation. Stable upon discharge.   ECG: Performed today and interpreted by me: normal EKG, normal sinus rhythm. No STEMI.  I have personally viewed the imaging studies obtained this visit. CXR: No acute changes appreciated. Radiologist reports mild diffuse bilateral interstitial opacity, consistent with edema or atypical/viral infection.  Reviewed expectations re: course of current medical issues. Questions answered. Outlined signs and symptoms indicating need for more acute intervention. Patient verbalized understanding. After Visit Summary given.   SUBJECTIVE:  History from: patient. Michelle Mcdowell is a 42 y.o. female who presents with complaint of feeling SOB; past two days. With associated "chest tightness". Feels worse today. "Feels like something is sitting on my chest". Describes waking at night feeling like she was drowning; two nights ago. Has needed to prop herself up at night. Noted elevated BP at home; max reading 170/119. Denies LE edema. Does smoke cigarettes. Denies recreational drug use. No OCP use. Denies: irregular heart beat, palpitations, and syncope. Also notes a dry cough starting today.   OBJECTIVE:  Vitals:   08/03/22 1438  BP: (!) 134/92  Pulse: (!) 103  Resp: 20  Temp: (!) 97.5 F (36.4 C)  TempSrc: Oral  SpO2: 98%    Slight tachycardia noted. General appearance: alert, oriented, no acute distress Eyes: PERRLA; EOMI; conjunctivae normal HENT: normocephalic; atraumatic Neck: supple with FROM Lungs: without labored respirations; speaks full sentences without difficulty; CTAB Heart: regular rate and rhythm Abdomen: soft Extremities:  without edema; without calf swelling or tenderness; symmetrical without gross deformities Skin: warm and dry; without rash or lesions Neuro: normal gait Psychological: alert and cooperative; normal mood and affect   Imaging: DG Chest 2 View  Result Date: 08/03/2022 CLINICAL DATA:  Shortness of breath EXAM: CHEST - 2 VIEW COMPARISON:  04/25/2022 FINDINGS: The heart size and mediastinal contours are within normal limits. Mild diffuse bilateral interstitial opacity. The visualized skeletal structures are unremarkable. IMPRESSION: Mild diffuse bilateral interstitial opacity, consistent with edema or atypical/viral infection. No focal airspace opacity. Electronically Signed   By: Jearld Lesch M.D.   On: 08/03/2022 15:15     No Known Allergies  Past Medical History:  Diagnosis Date   Back pain    Migraine    Social History   Socioeconomic History   Marital status: Significant Other    Spouse name: Not on file   Number of children: Not on file   Years of education: Not on file   Highest education level: Not on file  Occupational History   Not on file  Tobacco Use   Smoking status: Former    Types: Cigarettes   Smokeless tobacco: Never  Vaping Use   Vaping Use: Never used  Substance and Sexual Activity   Alcohol use: No   Drug use: No   Sexual activity: Yes    Birth control/protection: Surgical    Comment: tubal  Other Topics Concern   Not on file  Social History Narrative   Not on file   Social Determinants of Health   Financial Resource Strain: Not on file  Food Insecurity: Not on file  Transportation Needs: Not on file  Physical Activity: Not on file  Stress: Not on file  Social Connections: Not on file  Intimate Partner Violence: Not on file   Family History  Problem Relation Age of Onset   Other Father        MVA   Healthy Mother    Past Surgical History:  Procedure Laterality Date   BACK SURGERY     COSMETIC SURGERY     KNEE SURGERY     TUBAL LIGATION   05/09/2011   Procedure: POST PARTUM TUBAL LIGATION;  Surgeon: Catalina Antigua, MD;  Location: [facility];  Service: Gynecology;  Laterality: N/AMardella Layman, MD 08/03/22 1538

## 2022-08-03 NOTE — ED Notes (Signed)
Patient is being discharged from the Urgent Care and sent to the Emergency Department via POV . Per MD, patient is in need of higher level of care due to chest tightness/shortness of breath. Patient is aware and verbalizes understanding of plan of care.  Vitals:   08/03/22 1438  BP: (!) 134/92  Pulse: (!) 103  Resp: 20  Temp: (!) 97.5 F (36.4 C)  SpO2: 98%

## 2022-08-07 IMAGING — US US PELVIS COMPLETE WITH TRANSVAGINAL
1 series · 13 of 25 positions shown · non-contrast
Comparison: 01/24/2021

CLINICAL DATA: Abnormal uterine bleeding, LMP 06/16/2021, irregular
menses, N 92.6, N



[Series 1: us pelvic complete with transvaginal · 13 of 112 slices shown]
[im 1/112]
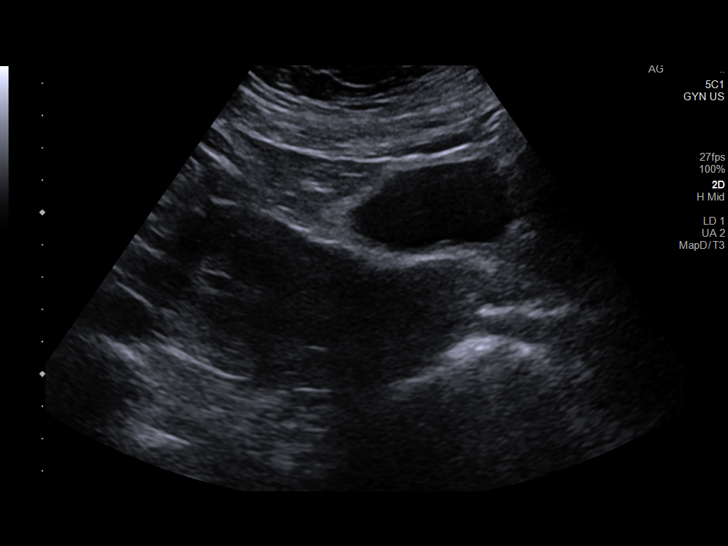
[im 10/112]
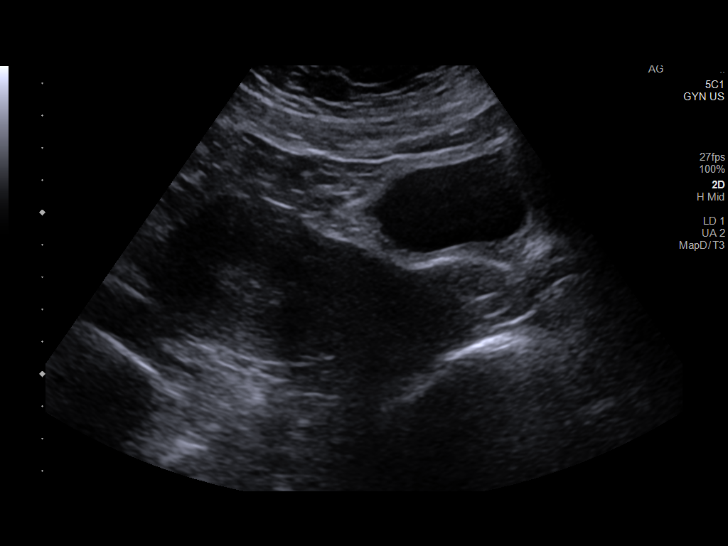
[im 19/112]
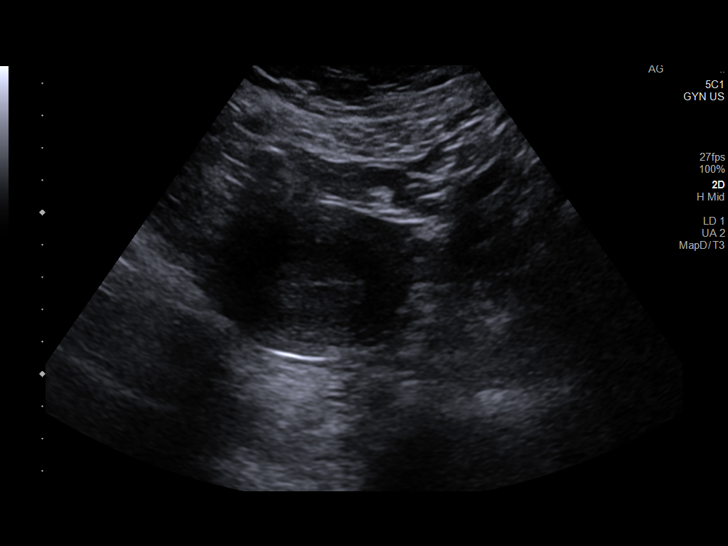
[im 28/112]
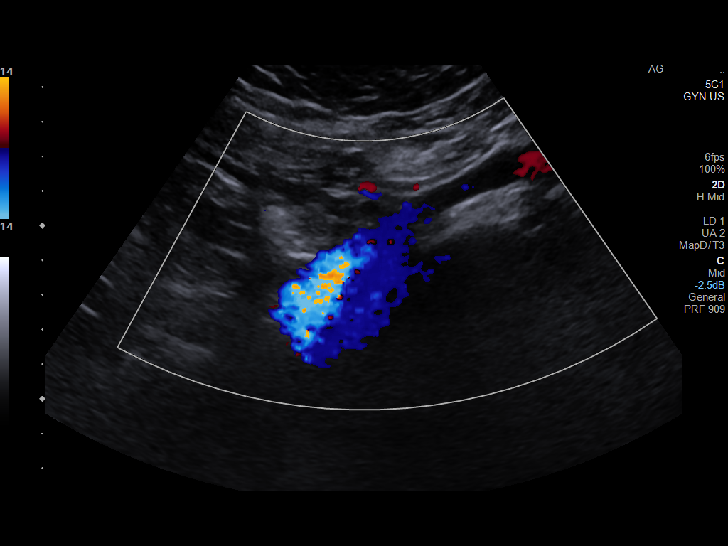
[im 38/112]
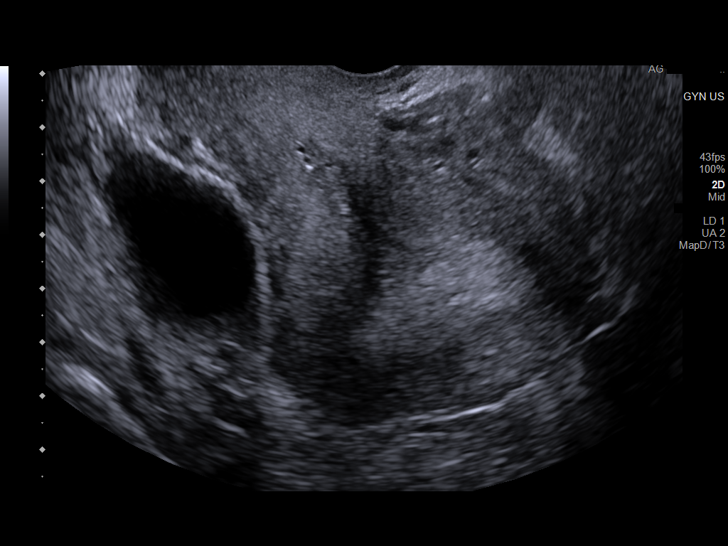
[im 47/112]
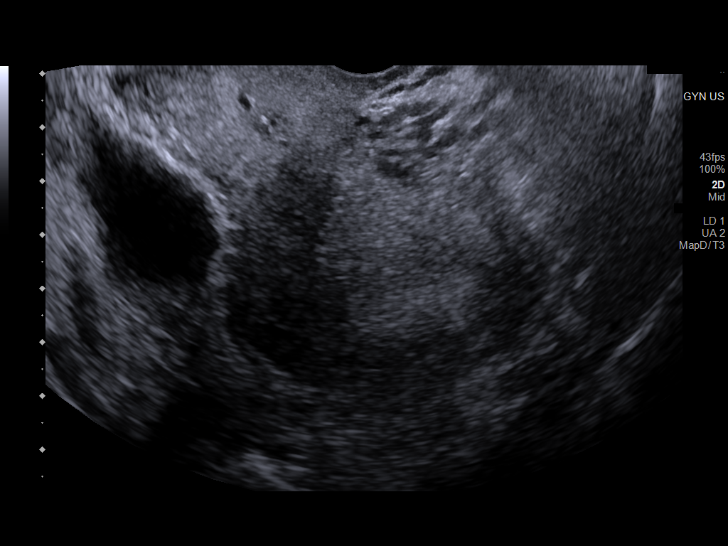
[im 56/112]
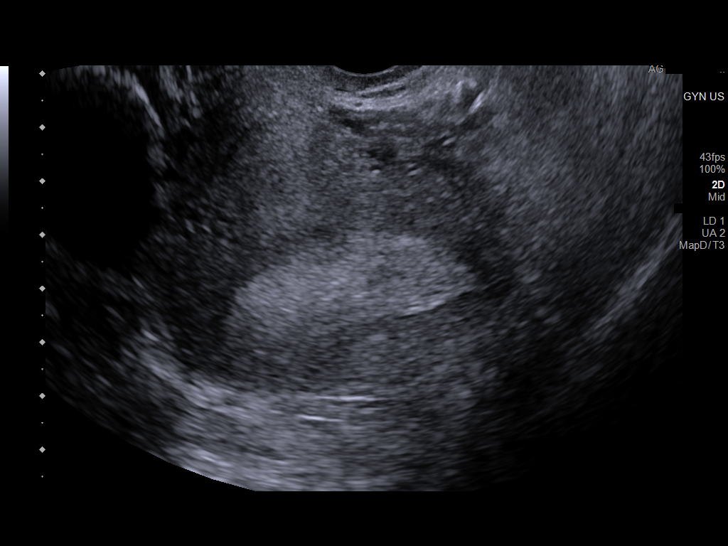
[im 65/112]
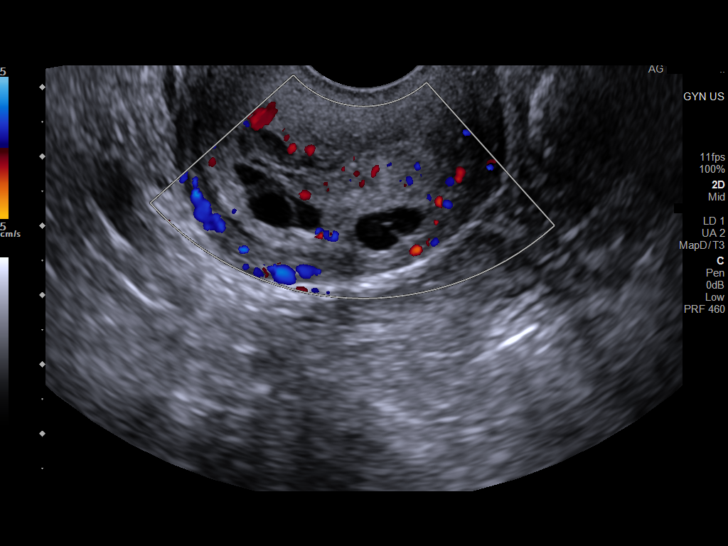
[im 75/112]
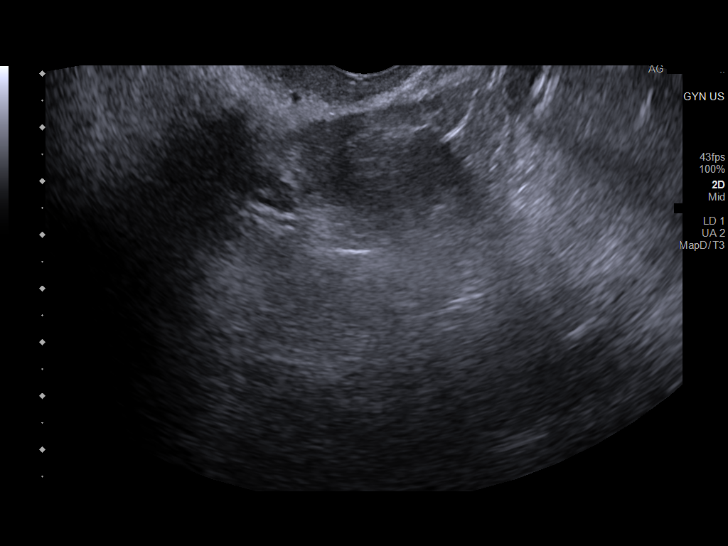
[im 84/112]
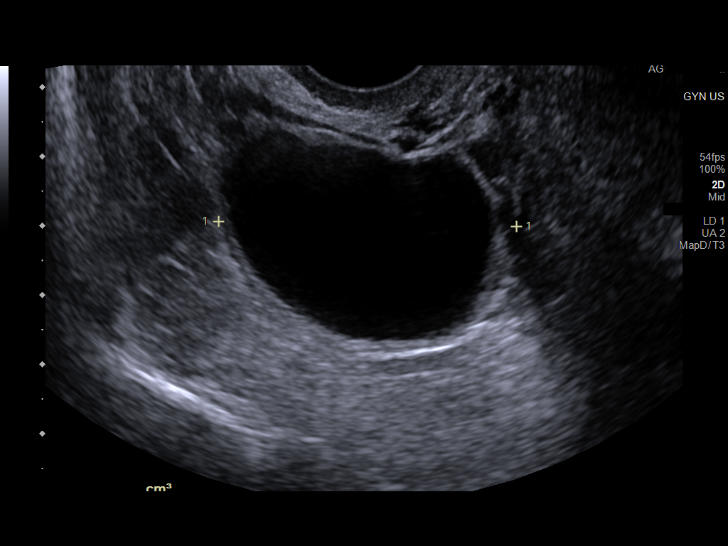
[im 93/112]
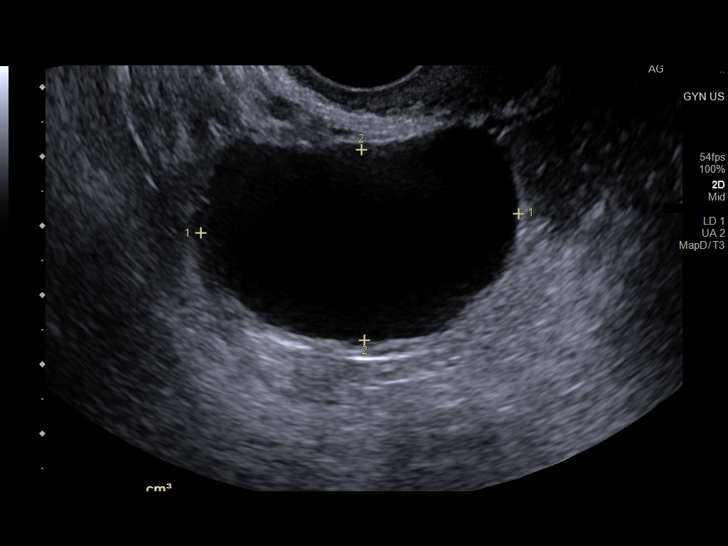
[im 102/112]
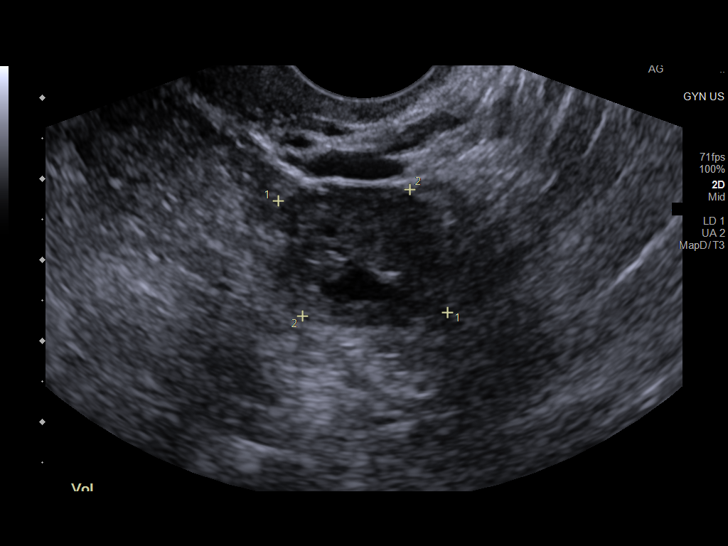
[im 112/112]
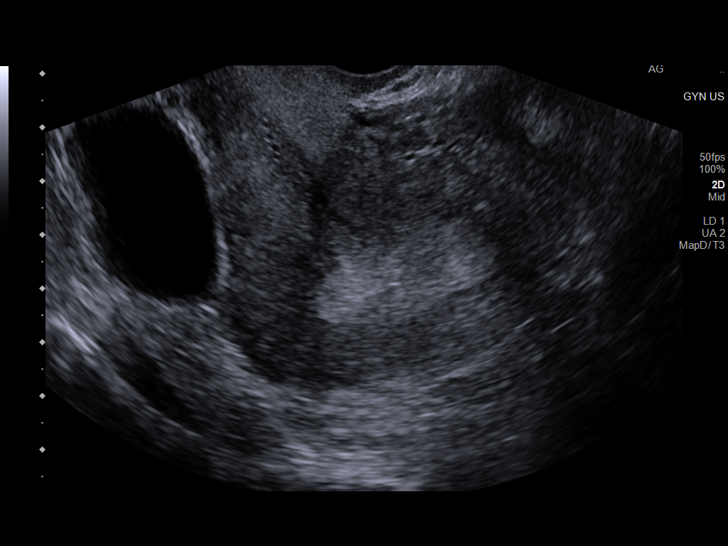

[13 of 25 positions shown; findings below may reference images not displayed]

FINDINGS: Uterus

Measurements: 9.8 x 5.0 x 6.2 cm = volume: 158 mL. Retroverted.
Mildly heterogeneous myometrium. No focal mass. Multiple nabothian
cysts at cervix.

Endometrium

Thickness: 29 mm. Thickened, heterogeneous. No discrete mass or
fluid

Right ovary

Measurements: 4.9 x 3.3 x 4.3 cm = volume: 36.4 mL. Simple appearing
RIGHT ovarian cyst 4.2 x 4.6 x 2.8 cm; No follow up imaging
recommended. Note: This recommendation does not apply to
premenarchal patients or to those with increased risk (genetic,
family history, elevated tumor markers or other high-risk factors)
of ovarian cancer. Reference: Radiology [DATE]):[id number].

Left ovary

Measurements: 2.5 x 2.1 x 1.8 cm = volume: 4.8 mL. Normal morphology
without mass

Other findings

No free pelvic fluid.  No adnexal masses.
IMPRESSION: Simple appearing RIGHT ovarian cyst 4.6 cm greatest size; no
follow-up imaging recommended as above.

Thickened, heterogeneous endometrial complex 29 mm thick; if
bleeding remains unresponsive to hormonal or medical therapy, focal
lesion work-up with sonohysterogram should be considered.
Endometrial biopsy should also be considered in pre-menopausal
patients at high risk for endometrial carcinoma. (Ref: Radiological
Reasoning: Algorithmic Workup of Abnormal Vaginal Bleeding with
Endovaginal Sonography and Sonohysterography. AJR [id number]; [id number])

## 2023-04-13 ENCOUNTER — Other Ambulatory Visit: Payer: Self-pay

## 2023-04-13 ENCOUNTER — Encounter: Payer: Self-pay | Admitting: Emergency Medicine

## 2023-04-13 ENCOUNTER — Ambulatory Visit
Admission: EM | Admit: 2023-04-13 | Discharge: 2023-04-13 | Disposition: A | Discharge status: Home / Self Care | Payer: BC Managed Care – PPO | Attending: Nurse Practitioner | Admitting: Nurse Practitioner

## 2023-04-13 DIAGNOSIS — M545 Low back pain, unspecified: Secondary | ICD-10-CM

## 2023-04-13 DIAGNOSIS — G8929 Other chronic pain: Secondary | ICD-10-CM | POA: Diagnosis not present

## 2023-04-13 MED ORDER — KETOROLAC TROMETHAMINE 30 MG/ML IJ SOLN
30.0000 mg | Freq: Once | INTRAMUSCULAR | Status: AC
  Administered 2023-04-13: 30 mg via INTRAMUSCULAR

## 2023-04-13 MED ORDER — PREDNISONE 20 MG PO TABS
40.0000 mg | ORAL_TABLET | Freq: Every day | ORAL | 0 refills | Status: AC
Start: 2023-04-13 — End: 2023-04-18

## 2023-04-13 MED ORDER — DEXAMETHASONE SODIUM PHOSPHATE 10 MG/ML IJ SOLN
10.0000 mg | INTRAMUSCULAR | Status: AC
  Administered 2023-04-13: 10 mg via INTRAMUSCULAR

## 2023-04-13 NOTE — ED Provider Notes (Signed)
 [facility]    CSN: [id number] Arrival date & time: 04/13/23  1027      History   Chief Complaint Chief Complaint  Patient presents with   Back Pain    HPI Michelle Mcdowell is a 43 y.o. female.   The history is provided by the patient.   Patient presents for complaints of right-sided low back pain.  Patient reports a history of low back pain.  States she went to help pick someone up, when she felt a pull in her right lower back.  States that the pain radiates down into the mid right thigh.  She denies numbness, tingling, lower extremity weakness, paresthesia, loss of bowel or bladder function.  Patient reports that she took ibuprofen , her previous muscle relaxer, and Advil  back and body with minimal relief.  Patient states that she has seen neurosurgery in the past, states that they want me to get a another back surgery.  Past Medical History:  Diagnosis Date   Back pain    Migraine     Patient Active Problem List   Diagnosis Date Noted   Morbid obesity (HCC) 06/28/2021   Cyst of right ovary 06/28/2021   Hypertension 06/28/2021   Tired 06/21/2021   Irregular periods 06/21/2021   Abnormal uterine bleeding (AUB) 06/21/2021   Pregnancy examination or test, negative result 06/21/2021   Elevated BP without diagnosis of hypertension 06/21/2021   Supervision of normal pregnancy 05/08/2011   Oligohydramnios 05/08/2011   History of fetal biophysical profile with non-stress test 05/08/2011    Past Surgical History:  Procedure Laterality Date   BACK SURGERY     COSMETIC SURGERY     KNEE SURGERY     TUBAL LIGATION  05/09/2011   Procedure: POST PARTUM TUBAL LIGATION;  Surgeon: Winton Felt, MD;  Location: [facility];  Service: Gynecology;  Laterality: N/A;    OB History     Gravida  6   Para  3   Term  3   Preterm      AB  3   Living  3      SAB  3   IAB      Ectopic      Multiple      Live Births  3            Home Medications     Prior to Admission medications   Medication Sig Start Date End Date Taking? Authorizing Provider  acetaminophen  (TYLENOL ) 325 MG tablet Take by mouth every 6 (six) hours as needed.    [provider]  ibuprofen  (ADVIL ) 800 MG tablet Take 1 tablet (800 mg total) by mouth every 8 (eight) hours as needed for moderate pain. Take with food to prevent GI upset 11/14/21   Chandra Harlene LABOR, NP  lisinopril -hydrochlorothiazide  (ZESTORETIC ) 10-12.5 MG tablet Take 1 tablet by mouth daily. 06/21/21   Signa Delon LABOR, NP  methocarbamol  1000 MG TABS Take 500 mg by mouth every 12 (twelve) hours as needed for muscle spasms. Do not take with alcohol or while driving or operating heavy machinery 11/14/21   Chandra Harlene LABOR, NP  tiZANidine  (ZANAFLEX ) 4 MG tablet Take 1 tablet (4 mg total) by mouth at bedtime. 11/18/21   Christopher Savannah, PA-C    Family History Family History  Problem Relation Age of Onset   Other Father        MVA   Healthy Mother     Social History Social History   Tobacco Use  Smoking status: Former    Types: Cigarettes   Smokeless tobacco: Never  Vaping Use   Vaping status: Never Used  Substance Use Topics   Alcohol use: No   Drug use: No     Allergies   Patient has no known allergies.   Review of Systems Review of Systems Per HPI  Physical Exam Triage Vital Signs ED Triage Vitals  Encounter Vitals Group     BP 04/13/23 1222 129/80     Systolic BP Percentile --      Diastolic BP Percentile --      Pulse Rate 04/13/23 1222 99     Resp 04/13/23 1222 20     Temp 04/13/23 1222 97.9 F (36.6 C)     Temp Source 04/13/23 1222 Oral     SpO2 04/13/23 1222 98 %     Weight --      Height --      Head Circumference --      Peak Flow --      Pain Score 04/13/23 1223 8     Pain Loc --      Pain Education --      Exclude from Growth Chart --    No data found.  Updated Vital Signs BP 129/80 (BP Location: Right Arm)   Pulse 99   Temp 97.9 F (36.6 C)  (Oral)   Resp 20   LMP 04/01/2023 (Approximate)   SpO2 98%   Visual Acuity Right Eye Distance:   Left Eye Distance:   Bilateral Distance:    Right Eye Near:   Left Eye Near:    Bilateral Near:     Physical Exam Vitals and nursing note reviewed.  Constitutional:      General: She is not in acute distress.    Appearance: Normal appearance.  HENT:     Head: Normocephalic.  Eyes:     Extraocular Movements: Extraocular movements intact.     Pupils: Pupils are equal, round, and reactive to light.  Pulmonary:     Effort: Pulmonary effort is normal.     Breath sounds: Normal breath sounds.  Musculoskeletal:     Cervical back: Normal range of motion.     Lumbar back: Spasms and tenderness present. No swelling, edema or deformity. Decreased range of motion. Negative right straight leg raise test and negative left straight leg raise test.  Skin:    General: Skin is warm and dry.  Neurological:     General: No focal deficit present.     Mental Status: She is alert and oriented to person, place, and time.  Psychiatric:        Mood and Affect: Mood normal.        Behavior: Behavior normal.      UC Treatments / Results  Labs (all labs ordered are listed, but only abnormal results are displayed) Labs Reviewed - No data to display  EKG   Radiology No results found.  Procedures Procedures (including critical care time)  Medications Ordered in UC Medications - No data to display  Initial Impression / Assessment and Plan / UC Course  I have reviewed the triage vital signs and the nursing notes.  Pertinent labs & imaging results that were available during my care of the patient were reviewed by me and considered in my medical decision making (see chart for details).  Patient presents with right sided low back pain.  This is a chronic issue for the patient.  She has seen neurosurgery  in the past, who recommended surgery.  No red flag symptoms noted on exam.  Toradol  30 mg IM  and Decadron  10 mg IM administered.  Will start patient on prednisone  40 mg for the next 5 days to help with inflammation.  Supportive care recommendations were provided and discussed with the patient to include stretching, the use of heat or ice, and remaining active.  Patient was given strict ER follow-up precautions.  Patient was in agreement with this plan of care and verbalized understanding.  All questions were answered.  Patient stable for discharge.  Final Clinical Impressions(s) / UC Diagnoses   Final diagnoses:  None   Discharge Instructions   None    ED Prescriptions   None    PDMP not reviewed this encounter.   Gilmer Etta PARAS, NP 04/13/23 1245

## 2023-04-13 NOTE — ED Triage Notes (Signed)
 Pt reports significant other is also having back problems and reports "felt something pull" in lower back "helping him up."   Pt reports hx of back surgery.

## 2023-04-13 NOTE — Discharge Instructions (Signed)
 You were given injections of Toradol  30 mg and Decadron  10 mg today.  Do not take any additional NSAIDs such as ibuprofen , Aleve , naproxen  or Naprosyn .  Recommend over-the-counter Tylenol  arthritis strength 650 mg tablets for breakthrough pain. Take medication as prescribed. Try to remain as active as possible. Gentle range of motion and stretching exercises to help with back spasm and pain. May apply ice or heat as needed.  Ice is recommended for pain or swelling, heat for spasm or stiffness.  Apply for 20 minutes, remove for 1 hour, then repeat. May take over-the-counter Tylenol  extra strength 500 mg tablet approximately 1 -2 hours after taking ibuprofen  for breakthrough pain. Go to the emergency department immediately if you develop weakness in your legs or feet, inability to walk, loss of bowel or bladder function, difficulty urinating or passing a bowel movement, or other concerns. If symptoms fail to improve, please follow-up with your neurosurgeon for further evaluation. Follow-up as needed.

## 2023-08-02 ENCOUNTER — Other Ambulatory Visit: Payer: Self-pay | Admitting: Adult Health

## 2023-08-02 ENCOUNTER — Ambulatory Visit
Admission: EM | Admit: 2023-08-02 | Discharge: 2023-08-02 | Disposition: A | Discharge status: Home / Self Care | Attending: Nurse Practitioner | Admitting: Nurse Practitioner

## 2023-08-02 DIAGNOSIS — Z8679 Personal history of other diseases of the circulatory system: Secondary | ICD-10-CM | POA: Diagnosis not present

## 2023-08-02 DIAGNOSIS — Z760 Encounter for issue of repeat prescription: Secondary | ICD-10-CM

## 2023-08-02 MED ORDER — LISINOPRIL-HYDROCHLOROTHIAZIDE 10-12.5 MG PO TABS: 1.0000 | ORAL_TABLET | Freq: Every day | ORAL | 0 refills | Status: DC

## 2023-08-02 NOTE — ED Triage Notes (Signed)
 Pt reports elevated BP, pt has a hx of high blood pressure, states she ran out of medication and does not have a primary care. Pt states she has a Headaches as a result of the elevated BP

## 2023-08-02 NOTE — ED Provider Notes (Signed)
 [facility]    CSN: [id number] Arrival date & time: 08/02/23  1404      History   Chief Complaint No chief complaint on file.   HPI Michelle Mcdowell is a 43 y.o. female.   The history is provided by the patient.   Patient presents for medication refill for medication for hypertension.  Patient states she has been out of of her Zestoretic  10-12.5 mg tablets for at least 2 to 3 months.  Today, she endorses headache.  She also states that she has noted to have some lower extremity swelling, and swelling in her hands.  Patient denies chest pain, difficulty breathing, blurred vision, lightheadedness, nausea, or vomiting.  Patient states that she is looking for a PCP at this time.  Patient states she has been monitoring her blood pressures at home.  States BPs have been ranging between 150-1 60s systolically and close to 100s diastolically.  Past Medical History:  Diagnosis Date   Back pain    Migraine     Patient Active Problem List   Diagnosis Date Noted   Morbid obesity (HCC) 06/28/2021   Cyst of right ovary 06/28/2021   Hypertension 06/28/2021   Tired 06/21/2021   Irregular periods 06/21/2021   Abnormal uterine bleeding (AUB) 06/21/2021   Pregnancy examination or test, negative result 06/21/2021   Elevated BP without diagnosis of hypertension 06/21/2021   Supervision of normal pregnancy 05/08/2011   Oligohydramnios 05/08/2011   History of fetal biophysical profile with non-stress test 05/08/2011    Past Surgical History:  Procedure Laterality Date   BACK SURGERY     COSMETIC SURGERY     KNEE SURGERY     TUBAL LIGATION  05/09/2011   Procedure: POST PARTUM TUBAL LIGATION;  Surgeon: Verlyn Goad, MD;  Location: [facility];  Service: Gynecology;  Laterality: N/A;    OB History     Gravida  6   Para  3   Term  3   Preterm      AB  3   Living  3      SAB  3   IAB      Ectopic      Multiple      Live Births  3            Home  Medications    Prior to Admission medications   Medication Sig Start Date End Date Taking? Authorizing Provider  acetaminophen  (TYLENOL ) 325 MG tablet Take by mouth every 6 (six) hours as needed.    [provider]  ibuprofen  (ADVIL ) 800 MG tablet Take 1 tablet (800 mg total) by mouth every 8 (eight) hours as needed for moderate pain. Take with food to prevent GI upset 11/14/21   Wilhemena Harbour, NP  lisinopril -hydrochlorothiazide  (ZESTORETIC ) 10-12.5 MG tablet Take 1 tablet by mouth daily. 06/21/21   Javan Messing, NP  methocarbamol  1000 MG TABS Take 500 mg by mouth every 12 (twelve) hours as needed for muscle spasms. Do not take with alcohol or while driving or operating heavy machinery 11/14/21   Wilhemena Harbour, NP  tiZANidine  (ZANAFLEX ) 4 MG tablet Take 1 tablet (4 mg total) by mouth at bedtime. 11/18/21   Adolph Hoop, PA-C    Family History Family History  Problem Relation Age of Onset   Other Father        MVA   Healthy Mother     Social History Social History   Tobacco Use   Smoking status:  Former    Types: Cigarettes   Smokeless tobacco: Never  Vaping Use   Vaping status: Never Used  Substance Use Topics   Alcohol use: No   Drug use: No     Allergies   Patient has no known allergies.   Review of Systems Review of Systems Per HPI  Physical Exam Triage Vital Signs ED Triage Vitals  Encounter Vitals Group     BP 08/02/23 1409 (!) 144/94     Systolic BP Percentile --      Diastolic BP Percentile --      Pulse Rate 08/02/23 1409 93     Resp 08/02/23 1409 20     Temp 08/02/23 1409 98.6 F (37 C)     Temp Source 08/02/23 1409 Oral     SpO2 08/02/23 1409 97 %     Weight --      Height --      Head Circumference --      Peak Flow --      Pain Score 08/02/23 1411 0     Pain Loc --      Pain Education --      Exclude from Growth Chart --    No data found.  Updated Vital Signs BP (!) 144/94 (BP Location: Right Arm)   Pulse 93   Temp  98.6 F (37 C) (Oral)   Resp 20   SpO2 97%   Visual Acuity Right Eye Distance:   Left Eye Distance:   Bilateral Distance:    Right Eye Near:   Left Eye Near:    Bilateral Near:     Physical Exam Vitals and nursing note reviewed.  Constitutional:      General: She is not in acute distress.    Appearance: Normal appearance.  HENT:     Head: Normocephalic.  Eyes:     Extraocular Movements: Extraocular movements intact.     Conjunctiva/sclera: Conjunctivae normal.     Pupils: Pupils are equal, round, and reactive to light.  Cardiovascular:     Rate and Rhythm: Normal rate and regular rhythm.     Pulses: Normal pulses.     Heart sounds: Normal heart sounds.  Pulmonary:     Effort: Pulmonary effort is normal. No respiratory distress.     Breath sounds: Normal breath sounds. No stridor. No wheezing, rhonchi or rales.  Abdominal:     General: Bowel sounds are normal.     Palpations: Abdomen is soft.     Tenderness: There is no abdominal tenderness.  Musculoskeletal:     Cervical back: Normal range of motion.     Right lower leg: No edema.     Left lower leg: No edema.  Lymphadenopathy:     Cervical: No cervical adenopathy.  Skin:    General: Skin is warm and dry.  Neurological:     General: No focal deficit present.     Mental Status: She is alert and oriented to person, place, and time.  Psychiatric:        Mood and Affect: Mood normal.        Behavior: Behavior normal.      UC Treatments / Results  Labs (all labs ordered are listed, but only abnormal results are displayed) Labs Reviewed - No data to display  EKG   Radiology No results found.  Procedures Procedures (including critical care time)  Medications Ordered in UC Medications - No data to display  Initial Impression / Assessment and Plan /  UC Course  I have reviewed the triage vital signs and the nursing notes.  Pertinent labs & imaging results that were available during my care of the  patient were reviewed by me and considered in my medical decision making (see chart for details).  Refill provided for Zestoretic  10/12.5 mg tablets.  Supportive care recommendations were provided and discussed with the patient to include dietary modifications for hypertension and incorporating regular exercise.  Patient was given strict ER follow-up precautions.  Patient advised to locate a PCP to establish care for continued monitoring of her blood pressure, patient advised to continue keeping a headache diary.  Patient was in agreement with this plan of care and verbalized understanding.  All questions were answered.  Patient stable for discharge.  Patient was given information for PCP in the area who is excepting patients.   Final Clinical Impressions(s) / UC Diagnoses   Final diagnoses:  None   Discharge Instructions   None    ED Prescriptions   None    PDMP not reviewed this encounter.   Hardy Lia, NP 08/02/23 [phone]

## 2023-08-02 NOTE — Discharge Instructions (Addendum)
 Take medication as prescribed. May take over-the-counter Tylenol  as needed for pain or discomfort. Continue to monitor your blood pressures. Recommend monitoring your sodium intake along with incorporating regular exercise to help maintain healthy blood pressures. Please follow-up with a PCP as soon as possible to establish care.  We are providing information for PCP office in the area who may be excepting patients. Go to the emergency department immediately if you develop chest pain, shortness of breath, difficulty breathing, blurred vision, worsening headache, or other concerns. Follow-up as needed.

## 2023-11-08 DIAGNOSIS — E559 Vitamin D deficiency, unspecified: Secondary | ICD-10-CM | POA: Diagnosis not present

## 2023-11-08 DIAGNOSIS — R739 Hyperglycemia, unspecified: Secondary | ICD-10-CM | POA: Diagnosis not present

## 2023-11-08 DIAGNOSIS — F411 Generalized anxiety disorder: Secondary | ICD-10-CM | POA: Diagnosis not present

## 2023-11-08 DIAGNOSIS — I10 Essential (primary) hypertension: Secondary | ICD-10-CM | POA: Diagnosis not present

## 2023-11-08 DIAGNOSIS — E039 Hypothyroidism, unspecified: Secondary | ICD-10-CM | POA: Diagnosis not present

## 2023-11-10 ENCOUNTER — Ambulatory Visit (INDEPENDENT_AMBULATORY_CARE_PROVIDER_SITE_OTHER)

## 2023-11-10 ENCOUNTER — Ambulatory Visit: Admission: EM | Admit: 2023-11-10 | Discharge: 2023-11-10 | Disposition: A | Discharge status: Home / Self Care

## 2023-11-10 ENCOUNTER — Encounter: Payer: Self-pay | Admitting: Emergency Medicine

## 2023-11-10 DIAGNOSIS — T887XXA Unspecified adverse effect of drug or medicament, initial encounter: Secondary | ICD-10-CM

## 2023-11-10 DIAGNOSIS — R0602 Shortness of breath: Secondary | ICD-10-CM

## 2023-11-10 DIAGNOSIS — R079 Chest pain, unspecified: Secondary | ICD-10-CM | POA: Diagnosis not present

## 2023-11-10 HISTORY — DX: Essential (primary) hypertension: I10

## 2023-11-10 NOTE — ED Triage Notes (Signed)
 States recently started on losartan and Zoloft on Thursday.    States went to work this morning and had an episode of being sweaty, SOB and dizziness.  States it feels like she has a weight sitting on her chest.

## 2023-11-10 NOTE — ED Provider Notes (Signed)
 [facility]    CSN: [id number] Arrival date & time: 11/10/23  0909      History   Chief Complaint Chief Complaint  Patient presents with   Chest Pain    HPI Michelle Mcdowell is a 43 y.o. female.   The history is provided by the patient.   Patient presents for complaints of shortness of breath, diaphoresis, dizziness, and chest heaviness.  She also endorses palpitations.  Patient states she was started on losartan 25 mg for her blood pressure and Zoloft 25 mg for anxiety 2 days ago.  Patient states that she takes the Zoloft at bedtime and the losartan in the mornings.  She states that when she wakes in the morning, she feels fine and is able to complete her normal activities.  States after she takes the losartan is when she begins to experience symptoms.  Patient reports that today her blood pressure is fine when it is usually running high.  Patient denies fever, chills, wheezing, difficulty breathing, abdominal pain, nausea, vomiting, or diarrhea.   Past Medical History:  Diagnosis Date   Back pain    Hypertension    Migraine     Patient Active Problem List   Diagnosis Date Noted   Morbid obesity (HCC) 06/28/2021   Cyst of right ovary 06/28/2021   Hypertension 06/28/2021   Tired 06/21/2021   Irregular periods 06/21/2021   Abnormal uterine bleeding (AUB) 06/21/2021   Pregnancy examination or test, negative result 06/21/2021   Elevated BP without diagnosis of hypertension 06/21/2021   Supervision of normal pregnancy 05/08/2011   Oligohydramnios 05/08/2011   History of fetal biophysical profile with non-stress test 05/08/2011    Past Surgical History:  Procedure Laterality Date   BACK SURGERY     COSMETIC SURGERY     KNEE SURGERY     TUBAL LIGATION  05/09/2011   Procedure: POST PARTUM TUBAL LIGATION;  Surgeon: Winton Felt, MD;  Location: [facility];  Service: Gynecology;  Laterality: N/A;    OB History     Gravida  6   Para  3   Term  3   Preterm       AB  3   Living  3      SAB  3   IAB      Ectopic      Multiple      Live Births  3            Home Medications    Prior to Admission medications   Medication Sig Start Date End Date Taking? Authorizing Provider  losartan (COZAAR) 25 MG tablet Take 50 mg by mouth daily.   Yes [provider]  sertraline (ZOLOFT) 25 MG tablet Take 25 mg by mouth daily.   Yes [provider]  acetaminophen  (TYLENOL ) 325 MG tablet Take by mouth every 6 (six) hours as needed.    [provider]  ibuprofen  (ADVIL ) 800 MG tablet Take 1 tablet (800 mg total) by mouth every 8 (eight) hours as needed for moderate pain. Take with food to prevent GI upset 11/14/21   Chandra Harlene LABOR, NP    Family History Family History  Problem Relation Age of Onset   Other Father        MVA   Healthy Mother     Social History Social History   Tobacco Use   Smoking status: Former    Types: Cigarettes   Smokeless tobacco: Never  Vaping Use   Vaping  status: Never Used  Substance Use Topics   Alcohol use: No   Drug use: No     Allergies   Patient has no known allergies.   Review of Systems Review of Systems   Physical Exam Triage Vital Signs ED Triage Vitals  Encounter Vitals Group     BP 11/10/23 0914 110/75     Girls Systolic BP Percentile --      Girls Diastolic BP Percentile --      Boys Systolic BP Percentile --      Boys Diastolic BP Percentile --      Pulse Rate 11/10/23 0914 88     Resp 11/10/23 0914 18     Temp 11/10/23 0914 97.9 F (36.6 C)     Temp Source 11/10/23 0914 Oral     SpO2 11/10/23 0914 95 %     Weight --      Height --      Head Circumference --      Peak Flow --      Pain Score 11/10/23 0916 4     Pain Loc --      Pain Education --      Exclude from Growth Chart --    No data found.  Updated Vital Signs BP 110/75 (BP Location: Right Arm)   Pulse 88   Temp 97.9 F (36.6 C) (Oral)   Resp 18   LMP 10/29/2023  (Approximate)   SpO2 95%   Visual Acuity Right Eye Distance:   Left Eye Distance:   Bilateral Distance:    Right Eye Near:   Left Eye Near:    Bilateral Near:     Physical Exam Vitals and nursing note reviewed.  Constitutional:      General: She is not in acute distress.    Appearance: She is well-developed.  HENT:     Head: Normocephalic.     Mouth/Throat:     Mouth: Mucous membranes are moist.  Eyes:     Extraocular Movements: Extraocular movements intact.     Conjunctiva/sclera: Conjunctivae normal.     Pupils: Pupils are equal, round, and reactive to light.  Cardiovascular:     Rate and Rhythm: Normal rate and regular rhythm.     Pulses: Normal pulses.     Heart sounds: Normal heart sounds.  Pulmonary:     Effort: Pulmonary effort is normal. No respiratory distress.     Breath sounds: Normal breath sounds. No stridor. No wheezing, rhonchi or rales.  Abdominal:     General: Bowel sounds are normal.     Palpations: Abdomen is soft.     Tenderness: There is no abdominal tenderness.  Musculoskeletal:     Cervical back: Normal range of motion.  Skin:    General: Skin is warm and dry.  Neurological:     General: No focal deficit present.     Mental Status: She is alert and oriented to person, place, and time.     GCS: GCS eye subscore is 4. GCS verbal subscore is 5. GCS motor subscore is 6.     Cranial Nerves: Cranial nerves 2-12 are intact.     Sensory: Sensation is intact.     Motor: Motor function is intact.     Coordination: Coordination is intact.     Gait: Gait is intact.  Psychiatric:        Mood and Affect: Mood normal.        Behavior: Behavior normal.  UC Treatments / Results  Labs (all labs ordered are listed, but only abnormal results are displayed) Labs Reviewed - No data to display  EKG: NSR no ectopy, no STEMI.  Compared to EKG dated 08/03/2022.   Radiology No results found.  Procedures Procedures (including critical care  time)  Medications Ordered in UC Medications - No data to display  Initial Impression / Assessment and Plan / UC Course  I have reviewed the triage vital signs and the nursing notes.  Pertinent labs & imaging results that were available during my care of the patient were reviewed by me and considered in my medical decision making (see chart for details).  The patient is EKG, chest x-ray, and orthostatic vital signs were all negative.  Patient reports symptoms started after she takes her newly prescribed losartan.  Symptoms consistent with possible medication side effects.  Patient was advised of same.  Supportive care recommendations were provided discussed with the patient to include taking 1/2 tablet of the prescribed medication until she can see her PCP, increasing fluids, allowing for plenty of rest, and to follow-up with PCP next week for further evaluation.  Patient was given ER follow-up precautions.  Patient is in agreement with this plan of care and verbalizes understanding.  All questions were answered.  Patient stable for discharge.  Final Clinical Impressions(s) / UC Diagnoses   Final diagnoses:  Shortness of breath   Discharge Instructions   None    ED Prescriptions   None    PDMP not reviewed this encounter.   Gilmer Etta PARAS, NP 11/10/23 1620

## 2023-11-10 NOTE — Discharge Instructions (Addendum)
 Your EKG was normal.  Your chest x-ray was negative. If possible, I would like for you to cut your losartan tablet in half for the next several days until you can follow-up with your primary care office. Increase fluids and allow for plenty of rest. You may take over-the-counter Tylenol  or ibuprofen  as needed for pain, fever, or general discomfort. Avoid sudden movement to prevent fall or further injury.  When you wake up in the morning, sit on side of the bed before moving around. I would like for you to follow-up with your primary care office on 11/12/2023 to discuss your medication. Go to the emergency department immediately if you experience worsening dizziness, shortness of breath, difficulty breathing, or other concerns. Follow-up as needed.

## 2023-11-14 DIAGNOSIS — F411 Generalized anxiety disorder: Secondary | ICD-10-CM | POA: Diagnosis not present

## 2023-11-14 DIAGNOSIS — I10 Essential (primary) hypertension: Secondary | ICD-10-CM | POA: Diagnosis not present

## 2023-11-14 DIAGNOSIS — Z0001 Encounter for general adult medical examination with abnormal findings: Secondary | ICD-10-CM | POA: Diagnosis not present

## 2024-02-18 ENCOUNTER — Ambulatory Visit: Admitting: Adult Health

## 2024-02-18 ENCOUNTER — Encounter: Payer: Self-pay | Admitting: Adult Health

## 2024-02-18 VITALS — BP 136/93 | HR 87 | Ht 62.0 in | Wt 273.0 lb

## 2024-02-18 DIAGNOSIS — I10 Essential (primary) hypertension: Secondary | ICD-10-CM

## 2024-02-18 DIAGNOSIS — N920 Excessive and frequent menstruation with regular cycle: Secondary | ICD-10-CM

## 2024-02-18 DIAGNOSIS — N946 Dysmenorrhea, unspecified: Secondary | ICD-10-CM

## 2024-02-18 DIAGNOSIS — Z3202 Encounter for pregnancy test, result negative: Secondary | ICD-10-CM | POA: Diagnosis not present

## 2024-02-18 LAB — POCT URINE PREGNANCY: Preg Test, Ur: NEGATIVE

## 2024-02-18 MED ORDER — NORETHINDRONE ACETATE 5 MG PO TABS: ORAL_TABLET | ORAL | 1 refills | Status: DC

## 2024-02-18 NOTE — Progress Notes (Signed)
  Subjective:     Patient ID: Michelle Mcdowell, female   DOB: 12/03/1980, 43 y.o.   MRN: [id number]  HPI Michelle Mcdowell is a 43 year old white female, with SO, [id number], in complaining of heavy periods for last 4-5 months, may bleed heavy for 5 days and change tampon and pad every 1-2 hours and has clots, with last period. She did this about 2 years ago.      Component Value Date/Time   DIAGPAP  06/21/2021 1235    - Negative for Intraepithelial Lesions or Malignancy (NILM)   DIAGPAP - Benign reactive/reparative changes 06/21/2021 1235   HPVHIGH Negative 06/21/2021 1235   ADEQPAP  06/21/2021 1235    Satisfactory for evaluation; transformation zone component PRESENT.   PCP is Dr Carlette   Review of Systems +heavy periods for last 4-5 months, may bleed heavy for 5 days and change tampon and pad every 1-2 hours and has clots, with last period. +cramps too    +tired Denies being dizzy or light headed Reviewed past medical,surgical, social and family history. Reviewed medications and allergies.  Objective:   Physical Exam BP (!) 136/93 (BP Location: Right Arm, Patient Position: Sitting, Cuff Size: Large)   Pulse 87   Ht 5' 2 (1.575 m)   Wt 273 lb (123.8 kg)   LMP 02/11/2024 (Exact Date)   BMI 49.93 kg/m     UPT is negative Skin warm and dry.  Lungs: clear to ausculation bilaterally. Cardiovascular: regular rate and rhythm. Fall risk is low  Upstream - 02/18/24 1522       Pregnancy Intention Screening   Does the patient want to become pregnant in the next year? No    Does the patient's partner want to become pregnant in the next year? No    Would the patient like to discuss contraceptive options today? No      Contraception Wrap Up   Current Method Female Sterilization    End Method Female Sterilization    Contraception Counseling Provided No           Assessment:     1. Negative pregnancy test - POCT urine pregnancy  2. Menorrhagia with regular cycle (Primary) heavy periods for  last 4-5 months, may bleed heavy for 5 days and change tampon and pad every 1-2 hours and has clots, with last period. She did this about 2 years ago.  Will get pelvic US  tomorrow in office to assess uterus and ovaries Will rx aygestin 5 mg 1 bid x 10 days to stop the bleeding  Meds ordered this encounter  Medications   norethindrone (AYGESTIN) 5 MG tablet    Sig: Take 1 bid x 10 days    Dispense:  20 tablet    Refill:  1    Supervising Provider:   JAYNE MINDER H [[id number]]    - US  PELVIC COMPLETE WITH TRANSVAGINAL; Future  3. Menstrual cramps  4. Hypertension, unspecified type Take cozaar 25 mg 1 daily and follow up with PCP    Plan:     Return in 1 day for pelvic US  in office

## 2024-02-19 ENCOUNTER — Ambulatory Visit (INDEPENDENT_AMBULATORY_CARE_PROVIDER_SITE_OTHER): Admitting: Radiology

## 2024-02-19 DIAGNOSIS — N920 Excessive and frequent menstruation with regular cycle: Secondary | ICD-10-CM | POA: Diagnosis not present

## 2024-02-19 NOTE — Progress Notes (Signed)
 GYN US : TA and TV imaging performed - vinyl probe cover used - Chaperone: Caroline Midplane uterus normal in size, symmetrical, homogeneous myometrium, no focal abn seen Endom thickness = 19.9 mm, heterogenous avascular cavity within fundal region.  Focally thickened endometrium without evidence of intracavitary defects. Left ovary appears normal, Right ovary seen with single avascular echofree cyst with thin smoothly marginated walls = 42 x 24 x 32 mm, ? Physiologic,   Both ovaries appear mobile, neg adnexal regions,  - neg CDS, no free fluid present

## 2024-02-21 ENCOUNTER — Other Ambulatory Visit (HOSPITAL_COMMUNITY): Payer: Self-pay | Admitting: Internal Medicine

## 2024-02-21 DIAGNOSIS — F411 Generalized anxiety disorder: Secondary | ICD-10-CM | POA: Diagnosis not present

## 2024-02-21 DIAGNOSIS — Z1231 Encounter for screening mammogram for malignant neoplasm of breast: Secondary | ICD-10-CM

## 2024-02-21 DIAGNOSIS — I10 Essential (primary) hypertension: Secondary | ICD-10-CM | POA: Diagnosis not present

## 2024-02-22 ENCOUNTER — Ambulatory Visit: Payer: Self-pay | Admitting: Adult Health

## 2024-02-28 ENCOUNTER — Ambulatory Visit (HOSPITAL_COMMUNITY)

## 2024-03-05 ENCOUNTER — Other Ambulatory Visit: Payer: Self-pay | Admitting: Adult Health

## 2024-03-14 ENCOUNTER — Other Ambulatory Visit (HOSPITAL_COMMUNITY)
Admission: RE | Admit: 2024-03-14 | Discharge: 2024-03-14 | Disposition: A | Source: Ambulatory Visit | Attending: Obstetrics & Gynecology | Admitting: Obstetrics & Gynecology

## 2024-03-14 ENCOUNTER — Ambulatory Visit: Admitting: Obstetrics & Gynecology

## 2024-03-14 ENCOUNTER — Encounter: Payer: Self-pay | Admitting: Obstetrics & Gynecology

## 2024-03-14 VITALS — BP 147/92 | HR 101 | Ht 62.0 in | Wt 270.0 lb

## 2024-03-14 DIAGNOSIS — R030 Elevated blood-pressure reading, without diagnosis of hypertension: Secondary | ICD-10-CM | POA: Diagnosis not present

## 2024-03-14 DIAGNOSIS — Z3202 Encounter for pregnancy test, result negative: Secondary | ICD-10-CM

## 2024-03-14 DIAGNOSIS — N939 Abnormal uterine and vaginal bleeding, unspecified: Secondary | ICD-10-CM

## 2024-03-14 LAB — POCT URINE PREGNANCY: Preg Test, Ur: NEGATIVE

## 2024-03-14 MED ORDER — NORETHINDRONE ACETATE 5 MG PO TABS: 10.0000 mg | ORAL_TABLET | Freq: Every day | ORAL | 4 refills | Status: DC

## 2024-03-14 NOTE — Progress Notes (Signed)
 GYN VISIT Patient name: Michelle Mcdowell MRN [id number]  Date of birth: 1980-06-29 Chief Complaint:   Procedure (Endo bx)  History of Present Illness:   Michelle Mcdowell is a 43 y.o. [phone] female being seen today for follow up regarding:  AUB: Patient was seen in early November where she had reported heavy periods for the past several months.  She noted that she was changing her pad plus tampon every 1-2 hours menses were lasting for approximately 5 days. Today she reports that basically since 10/4-she is continue to have irregular bleeding.   As mentioned she was seen on 11/10- she was given norethindrone  2 tabs twice daily for 10 days, which did help to improve her bleeding however she ran out of the medicine and the bleeding had decreased.  Then had intercourse and bleeding returned which was about 3-4 weeks ago.  Just restarted the medication this past Monday and has noted improvement.  Outside of the irregular bleeding, she reports no acute GYN concerns.  Denies pelvic or abdominal pain.  Denies urinary concerns.  Contraception: BTL  Patient's last menstrual period was 02/11/2024 (exact date).    Review of Systems:   Pertinent items are noted in HPI Denies fever/chills, dizziness, headaches, visual disturbances, fatigue, shortness of breath, chest pain, abdominal pain, vomiting. Pertinent History Reviewed:   Past Surgical History:  Procedure Laterality Date   BACK SURGERY     COSMETIC SURGERY     KNEE SURGERY     TUBAL LIGATION  05/09/2011   Procedure: POST PARTUM TUBAL LIGATION;  Surgeon: Winton Felt, MD;  Location: [facility];  Service: Gynecology;  Laterality: N/A;    Past Medical History:  Diagnosis Date   Back pain    Hypertension    Migraine    Reviewed problem list, medications and allergies. Physical Assessment:   Vitals:   03/14/24 0926 03/14/24 0942  BP: (!) 140/84 (!) 147/92  Pulse: 86 (!) 101  Weight: 270 lb (122.5 kg)   Height: 5' 2 (1.575 m)    Body mass index is 49.38 kg/m.       Physical Examination:   General appearance: alert, well appearing, and in no distress  Psych: mood appropriate, normal affect  Skin: warm & dry   Cardiovascular: normal heart rate noted  Respiratory: normal respiratory effort, no distress  Abdomen: obese, soft, non-tender   Pelvic: VULVA: normal appearing vulva with no masses, tenderness or lesions, VAGINA: normal appearing vagina with normal color and discharge, no lesions, CERVIX: normal appearing cervix without discharge or lesions, UTERUS: uterus is normal size, shape, consistency and nontender  Extremities: no edema   Chaperone: Alan Fischer    Endometrial Biopsy Procedure Note  Pre-operative Diagnosis: Abnormal uterine bleeding  Post-operative Diagnosis: same  Procedure Details   Urine pregnancy test was negative.  The risks (including infection, bleeding, pain, and uterine perforation) and benefits of the procedure were explained to the patient and Written informed consent was obtained.  Antibiotic prophylaxis against endocarditis was not indicated.   The patient was placed in the dorsal lithotomy position.  Bimanual exam showed the uterus to be in the neutral position.  A speculum inserted in the vagina, and the cervix prepped with betadine.     A single tooth tenaculum was applied to the anterior lip of the cervix for stabilization.  Os finder was used.  A Pipelle endometrial aspirator was used to sample the endometrium.  Sample was sent for pathologic examination.  Condition: Stable  Complications:  None   The patient was advised to call for any fever or for prolonged or severe pain or bleeding. She was advised to use OTC analgesics as needed for mild to moderate pain. She was advised to avoid vaginal intercourse for 48 hours or until the bleeding has completely stopped.   Assessment & Plan:  1) AUB - Recommendation for EMB to rule out underlying etiology.  Procedure completed  today - Next step pending results of pathology. - Discussed management options including continuing current pill, LARC such as Mirena - Briefly discussed surgical intervention.  However patient is hesitant due to recovery - Since she has seen improvement with norethindrone  we will plan to continue and encourage patient to continue taking medicine daily - Once she completes 2 tablets twice daily, refill sent in for 2 tablets daily - Plan to follow-up in 3 months  Meds ordered this encounter  Medications   norethindrone  (GALLIFREY ) 5 MG tablet    Sig: Take 2 tablets (10 mg total) by mouth daily.    Dispense:  180 tablet    Refill:  4    This prescription was filled on 03/05/2024. Any refills authorized will be placed on file.     -Elevated BP Will continue to monitor, normal BP at last visit   Orders Placed This Encounter  Procedures   POCT urine pregnancy    Return in about 3 months (around 06/12/2024) for Medication follow up.   Bellarae Lizer, DO Attending Obstetrician & Gynecologist, [facility] for [employer], [facility]

## 2024-03-17 ENCOUNTER — Ambulatory Visit: Payer: Self-pay | Admitting: Obstetrics & Gynecology

## 2024-03-17 LAB — SURGICAL PATHOLOGY

## 2024-03-31 ENCOUNTER — Other Ambulatory Visit: Payer: Self-pay | Admitting: Adult Health

## 2024-03-31 ENCOUNTER — Telehealth: Payer: Self-pay | Admitting: *Deleted

## 2024-03-31 DIAGNOSIS — N939 Abnormal uterine and vaginal bleeding, unspecified: Secondary | ICD-10-CM

## 2024-03-31 NOTE — Telephone Encounter (Signed)
 Pt is still bleeding. Pt is taking Norethindrone . Has been on this pill for about 2 months. Pt takes 2 pills every morning. Bleeding is not quite as bad but pt states she is filling up a pad every hour. When pt is up and moving around, bleeding is worse. Cramps are bad. Talking Midol  complete. Please advise. Thanks! JSY

## 2024-03-31 NOTE — Telephone Encounter (Addendum)
 Pt feels dizzy when she bends over and has times when she feels like she could pass out. I advised she needs to go to ER for evaluation. Pt voiced understanding. JSY

## 2024-04-04 ENCOUNTER — Other Ambulatory Visit: Payer: Self-pay

## 2024-04-04 ENCOUNTER — Encounter (HOSPITAL_BASED_OUTPATIENT_CLINIC_OR_DEPARTMENT_OTHER): Payer: Self-pay

## 2024-04-04 ENCOUNTER — Emergency Department (HOSPITAL_BASED_OUTPATIENT_CLINIC_OR_DEPARTMENT_OTHER)
Admission: EM | Admit: 2024-04-04 | Discharge: 2024-04-04 | Disposition: A | Discharge status: Home / Self Care | Attending: Emergency Medicine | Admitting: Emergency Medicine

## 2024-04-04 ENCOUNTER — Emergency Department (HOSPITAL_BASED_OUTPATIENT_CLINIC_OR_DEPARTMENT_OTHER)

## 2024-04-04 DIAGNOSIS — I10 Essential (primary) hypertension: Secondary | ICD-10-CM | POA: Insufficient documentation

## 2024-04-04 DIAGNOSIS — R1032 Left lower quadrant pain: Secondary | ICD-10-CM | POA: Diagnosis present

## 2024-04-04 DIAGNOSIS — Z79899 Other long term (current) drug therapy: Secondary | ICD-10-CM | POA: Diagnosis not present

## 2024-04-04 DIAGNOSIS — R1024 Suprapubic pain: Secondary | ICD-10-CM

## 2024-04-04 DIAGNOSIS — N939 Abnormal uterine and vaginal bleeding, unspecified: Secondary | ICD-10-CM | POA: Insufficient documentation

## 2024-04-04 LAB — COMPREHENSIVE METABOLIC PANEL WITH GFR
ALT: 22 U/L (ref 0–44)
AST: 17 U/L (ref 15–41)
Albumin: 4.2 g/dL (ref 3.5–5.0)
Alkaline Phosphatase: 54 U/L (ref 38–126)
Anion gap: 11 (ref 5–15)
BUN: 7 mg/dL (ref 6–20)
CO2: 23 mmol/L (ref 22–32)
Calcium: 9.3 mg/dL (ref 8.9–10.3)
Chloride: 105 mmol/L (ref 98–111)
Creatinine, Ser: 0.85 mg/dL (ref 0.44–1.00)
GFR, Estimated: >60 mL/min
Glucose, Bld: 95 mg/dL (ref 70–99)
Potassium: 3.7 mmol/L (ref 3.5–5.1)
Sodium: 139 mmol/L (ref 135–145)
Total Bilirubin: 0.2 mg/dL (ref 0.0–1.2)
Total Protein: 7.3 g/dL (ref 6.5–8.1)

## 2024-04-04 LAB — CBC
HCT: 35.2 % — ABNORMAL LOW (ref 36.0–46.0)
Hemoglobin: 11.3 g/dL — ABNORMAL LOW (ref 12.0–15.0)
MCH: 27.6 pg (ref 26.0–34.0)
MCHC: 32.1 g/dL (ref 30.0–36.0)
MCV: 85.9 fL (ref 80.0–100.0)
Platelets: 419 K/uL — ABNORMAL HIGH (ref 150–400)
RBC: 4.1 MIL/uL (ref 3.87–5.11)
RDW: 12.8 % (ref 11.5–15.5)
WBC: 6.6 K/uL (ref 4.0–10.5)
nRBC: 0 % (ref 0.0–0.2)

## 2024-04-04 LAB — URINALYSIS, ROUTINE W REFLEX MICROSCOPIC
Bacteria, UA: NONE SEEN
Bilirubin Urine: NEGATIVE
Glucose, UA: NEGATIVE mg/dL
Ketones, ur: NEGATIVE mg/dL
Leukocytes,Ua: NEGATIVE
Nitrite: NEGATIVE
Specific Gravity, Urine: 1.026 (ref 1.005–1.030)
pH: 5.5 (ref 5.0–8.0)

## 2024-04-04 LAB — PREGNANCY, URINE: Preg Test, Ur: NEGATIVE

## 2024-04-04 LAB — LIPASE, BLOOD: Lipase: 31 U/L (ref 11–51)

## 2024-04-04 MED ORDER — MEGESTROL ACETATE 40 MG PO TABS: 40.0000 mg | ORAL_TABLET | Freq: Two times a day (BID) | ORAL | 0 refills | Status: AC

## 2024-04-04 MED ORDER — IOHEXOL 300 MG/ML  SOLN
100.0000 mL | Freq: Once | INTRAMUSCULAR | Status: AC | PRN
  Administered 2024-04-04: 100 mL via INTRAVENOUS

## 2024-04-04 MED ORDER — MEGESTROL ACETATE 40 MG PO TABS
40.0000 mg | ORAL_TABLET | Freq: Every day | ORAL | Status: DC
  Administered 2024-04-04: 40 mg via ORAL
  Filled 2024-04-04: qty 1

## 2024-04-04 MED ORDER — MEGESTROL ACETATE 40 MG PO TABS
40.0000 mg | ORAL_TABLET | Freq: Every day | ORAL | Status: DC
  Filled 2024-04-04: qty 1

## 2024-04-04 NOTE — ED Notes (Signed)
 ED Provider at bedside.

## 2024-04-04 NOTE — ED Notes (Signed)
 Patient transported to CT

## 2024-04-04 NOTE — ED Triage Notes (Signed)
 Patient reports right sided pain. She also reports vaginal bleeding for 3 months. Has seen multiple providers for this. They recommend she come here.

## 2024-04-04 NOTE — ED Provider Notes (Signed)
 " [place] [facility] DE[facility] AT [facility] Provider Note   CSN: [id number] Arrival date & time: 04/04/24  1308     Patient presents with: Abdominal Pain   Michelle Mcdowell is a 43 y.o. female with past medical history of HTN, migraine presents Emergency Department for evaluation of vaginal bleeding, suprapubic pain over the past 3 months.  Reports that she has been changing her pad and tampon every 1-2 hours.  Linus Lewis NP and Dr. Ozan at [facility] health OB/GYN who performed TVUS on 02/19/2024 that was normal.  Per chart review, recommend endometrial biopsy.  Has been taking Aygestin  5 mg twice daily for past month without improvement to bleeding.  Reports that OB/GYN recommended ED evaluation for symptoms.  Denies dizziness, lightheadedness, urinary symptoms, fevers, concern for STDs.     Abdominal Pain      Prior to Admission medications  Medication Sig Start Date End Date Taking? Authorizing Provider  losartan (COZAAR) 50 MG tablet Take 50 mg by mouth daily. 03/26/24  Yes [provider]  acetaminophen  (TYLENOL ) 325 MG tablet Take by mouth every 6 (six) hours as needed.    [provider]  losartan (COZAAR) 25 MG tablet Take 50 mg by mouth daily.    [provider]  megestrol  (MEGACE ) 40 MG tablet Take 1 tablet (40 mg total) by mouth 2 (two) times daily for 20 days. 04/04/24 04/24/24 Yes Minnie Tinnie BRAVO, PA  norethindrone  (GALLIFREY ) 5 MG tablet Take 2 tablets (10 mg total) by mouth daily. 03/14/24 06/12/24  Ozan, Jennifer, DO  sertraline (ZOLOFT) 25 MG tablet Take 25 mg by mouth daily.    [provider]    Allergies: Patient has no known allergies.    Review of Systems  Gastrointestinal:  Positive for abdominal pain.    Updated Vital Signs BP 137/85   Pulse 76   Temp 97.9 F (36.6 C) (Oral)   Resp 15   SpO2 98%   Physical Exam Vitals and nursing note reviewed. Exam conducted with a chaperone present.  Constitutional:       General: She is not in acute distress.    Appearance: Normal appearance.  HENT:     Head: Normocephalic and atraumatic.  Eyes:     Conjunctiva/sclera: Conjunctivae normal.  Cardiovascular:     Rate and Rhythm: Normal rate.  Pulmonary:     Effort: Pulmonary effort is normal. No respiratory distress.  Abdominal:     Tenderness: There is abdominal tenderness in the suprapubic area.     Comments: Nonsurgical abdomen with no peritoneal signs  Genitourinary:    General: Normal vulva.     Exam position: Knee-chest position.     Vagina: Bleeding present.     Cervix: No cervical motion tenderness.     Adnexa:        Right: No tenderness.         Left: No tenderness.       Comments: Os mildly open with small amount of bleeding Skin:    Coloration: Skin is not jaundiced or pale.  Neurological:     Mental Status: She is alert. Mental status is at baseline.   Chaperoned PE with Delanna Polite RN  (all labs ordered are listed, but only abnormal results are displayed) Labs Reviewed  CBC - Abnormal; Notable for the following components:      Result Value   Hemoglobin 11.3 (*)    HCT 35.2 (*)    Platelets 419 (*)  All other components within normal limits  URINALYSIS, ROUTINE W REFLEX MICROSCOPIC - Abnormal; Notable for the following components:   Hgb urine dipstick LARGE (*)    Protein, ur TRACE (*)    All other components within normal limits  LIPASE, BLOOD  COMPREHENSIVE METABOLIC PANEL WITH GFR  PREGNANCY, URINE    EKG: None  Radiology: CT ABDOMEN PELVIS W CONTRAST Result Date: 04/04/2024 EXAM: CT ABDOMEN AND PELVIS WITH CONTRAST 04/04/2024 08:57:47 PM TECHNIQUE: CT of the abdomen and pelvis was performed with the administration of 100 mL of iohexol  (OMNIPAQUE ) 300 MG/ML solution. Multiplanar reformatted images are provided for review. Automated exposure control, iterative reconstruction, and/or weight-based adjustment of the mA/kV was utilized to reduce the radiation dose  to as low as reasonably achievable. COMPARISON: CT without contrast 09/13/2008. CLINICAL HISTORY: LLQ abdominal pain. Vaginal bleeding for 3 months and reports right-sided pain as well. FINDINGS: LOWER CHEST: No acute abnormality. LIVER: The liver is 22 cm in length and mildly steatotic without mass. GALLBLADDER AND BILE DUCTS: Gallbladder is unremarkable. No biliary ductal dilatation. SPLEEN: No acute abnormality. PANCREAS: No acute abnormality. ADRENAL GLANDS: There is no adrenal mass. KIDNEYS, URETERS AND BLADDER: There is no renal mass. There is a solitary 1 mm nonobstructing calyceal stone in the superior pole of the left kidney. There is no further nephrolithiasis. No hydronephrosis or ureteral stones . No perinephric or periureteral stranding. Urinary bladder is unremarkable. GI AND BOWEL: Stomach demonstrates no acute abnormality. There is no bowel obstruction. There is a normal caliber retrocecal appendix. No findings of acute colitis or diverticulitis. There is no incarcerated hernia. PERITONEUM AND RETROPERITONEUM: Minimal low-density fluid in the pelvic cul-de-sac is similar to the prior study and is probably physiologic. There is no free hemorrhage, free air, localizing inflammatory process or mass. No findings to definitively explain her abdominal pain. VASCULATURE: Aorta is normal in caliber. LYMPH NODES: No lymphadenopathy. REPRODUCTIVE ORGANS: The uterus and ovaries are not enlarged or otherwise remarkable by CT. There is no overt thickening in the vaginal cuff either. BONES AND SOFT TISSUES: There is degenerative disc disease and spondylosis of L5-S1, spurring at the symphysis pubis with findings of osteitis pubis. No acute or other significant osseous findings. There is a small fat-containing umbilical hernia. No focal soft tissue abnormality. IMPRESSION: 1. No acute CT findings in the abdomen or pelvis. No findings to definitively explain her abdominal pain. . 2. Uterus, ovaries, and vaginal cuff  are unremarkable by CT. 3. Mildly enlarged liver with mild steatosis. Electronically signed by: Francis Quam MD 04/04/2024 10:22 PM EST RP Workstation: [id number]      Medications Ordered in the ED  megestrol  (MEGACE ) tablet 40 mg (has no administration in time range)  iohexol  (OMNIPAQUE ) 300 MG/ML solution 100 mL (100 mLs Intravenous Contrast Given 04/04/24 2049)                                    Medical Decision Making Amount and/or Complexity of Data Reviewed Labs: ordered. Radiology: ordered.  Risk Prescription drug management.   Patient presents to the ED for concern of vaginal bleeding, suprapubic abdominal pain, this involves an extensive number of treatment options, and is a complaint that carries with it a high risk of complications and morbidity.  The differential diagnosis includes PID, symptomatic anemia, cyst, ovarian torsion   Co morbidities that complicate the patient evaluation  See HPI   Additional history obtained:  Additional history obtained from Nursing and Outside Medical Records   External records from outside source obtained and reviewed including triage RN note, OBGYN note from 02/18/2024, TVUS from 02/19/2024   Lab Tests:  I Ordered, and personally interpreted labs.  The pertinent results include:   Hgb 11.3 UA with large Hgb but no signs of infection hCG negative   Imaging Studies ordered:  I ordered imaging studies including CT abd pelvis  I independently visualized and interpreted imaging which showed  No acute intra-abdominal abnormalities I agree with the radiologist interpretation    Medicines ordered and prescription drug management:  I ordered medication including megace   for vaginal bleeding  Reevaluation of the patient after these medicines showed that the patient stayed the same I have reviewed the patients home medicines and have made adjustments as needed    Consultations Obtained:  I requested consultation with  GYN Dr, Cleatus,  and discussed lab and imaging findings as well as pertinent plan - they recommend:  Megace  40mg BID  Do not need to repeat TVUS Outpatient follow up   Problem List / ED Course:  Vaginal bleeding Suprapubic pain Vital signs hemodynamically stable no fever no tachycardia.  No hypotension. No complaints of dizziness, lightheadedness.  Hgb 11.3.  Baseline is 11.3-12.4 over past 12 years.  No need for transfusion. Patient has been having vaginal bleeding, suprapubic pain for past 3 months.  She is being followed by OB/GYN about this.  She last had TVUS on 02/19/2024 that was completely normal.  As symptoms were persisting at that time, do not feel that ultrasound needs to repeat repeated.  She has no focal tenderness to LLQ nor RLQ.  There is no new vomiting.  Low suspicion for torsion.  I discussed this with on-call GYN who agrees with this. Low suspicion for PID as patient has no CMT tenderness.  No concern for STDs. I did proceed with CT imaging as patient was reporting that her suprapubic pain has been worsening.  She is tender there.  Labs without obvious leukocytosis.  She has no fever.  Does not seem consistent with sepsis or infection however wanted to ensure no intra-abdominal pathology of the suprapubic pain as TVUS obtained previously was normal.  Fortunately, CT is without abnormalities.  Therefore pain is likely secondary to GYN etiology As patient has not improved with Aygestin , think patient would benefit from changing medication to control menses.  Discussed this with OB/GYN who recommended Megace .  Provided 1 dose of Megace  here in emergency department as well as a prescription. Discussed symptomatic treatment at home and recommendation to follow-up with OB/GYN   Reevaluation:  After the interventions noted above, I reevaluated the patient and found that they have :stayed the same     Dispostion:  After consideration of the diagnostic results and the patients  response to treatment, I feel that the patent would benefit from outpatient management with OBGYN f/u.   Discussed ED workup, disposition, return to ED precautions with patient who expresses understanding agrees with plan.  All questions answered to their satisfaction.  They are agreeable to plan.  Discharge instructions provided on paperwork  Final diagnoses:  Vaginal bleeding  Suprapubic pain    ED Discharge Orders          Ordered    megestrol  (MEGACE ) 40 MG tablet  2 times daily        04/04/24 2231             Minnie Tinnie BRAVO, PA  04/04/24 2240    Pamella Ozell LABOR, DO 04/06/24 2013  "

## 2024-04-04 NOTE — ED Notes (Signed)
 Reviewed discharge instructions, medications, and home care with pt. Pt verbalized understanding and had no further questions. Pt exited ED without complications.

## 2024-04-04 NOTE — Discharge Instructions (Addendum)
 Thank for letting us  evaluate you today.  Your lab work is unremarkable.  Your CT imaging does not show any abnormalities.  Given your dose of Megace  here Emergency Department for vaginal bleeding.  I have also sent a prescription.  Please take as prescribed.  You may stop your other prescription of Galafrey and follow-up with OB/GYN for further management  Return to Emergency Department for experience dizziness, lightheadedness, worsening vaginal bleeding

## 2024-05-14 ENCOUNTER — Other Ambulatory Visit: Payer: Self-pay | Admitting: Adult Health

## 2024-05-14 MED ORDER — MEGESTROL ACETATE 40 MG PO TABS: ORAL_TABLET | ORAL | 1 refills | Status: AC

## 2024-05-14 NOTE — Progress Notes (Signed)
Rx megace??
